# Patient Record
Sex: Female | Born: 1960 | Race: White | Hispanic: No | Marital: Married | Smoking: Former smoker
Health system: Southern US, Community
[De-identification: ages and names within clinical notes are randomized; demographics above are authoritative.]

## PROBLEM LIST (undated history)

## (undated) DIAGNOSIS — G4730 Sleep apnea, unspecified: Secondary | ICD-10-CM

## (undated) DIAGNOSIS — R911 Solitary pulmonary nodule: Secondary | ICD-10-CM

## (undated) DIAGNOSIS — Z86010 Personal history of colonic polyps: Secondary | ICD-10-CM

## (undated) DIAGNOSIS — J189 Pneumonia, unspecified organism: Secondary | ICD-10-CM

## (undated) DIAGNOSIS — K219 Gastro-esophageal reflux disease without esophagitis: Secondary | ICD-10-CM

## (undated) DIAGNOSIS — Z87898 Personal history of other specified conditions: Secondary | ICD-10-CM

## (undated) DIAGNOSIS — E785 Hyperlipidemia, unspecified: Secondary | ICD-10-CM

## (undated) DIAGNOSIS — R609 Edema, unspecified: Secondary | ICD-10-CM

## (undated) DIAGNOSIS — D649 Anemia, unspecified: Secondary | ICD-10-CM

## (undated) DIAGNOSIS — E039 Hypothyroidism, unspecified: Secondary | ICD-10-CM

## (undated) DIAGNOSIS — T7840XA Allergy, unspecified, initial encounter: Secondary | ICD-10-CM

## (undated) HISTORY — DX: Solitary pulmonary nodule: R91.1

## (undated) HISTORY — DX: Personal history of other specified conditions: Z87.898

## (undated) HISTORY — PX: HERNIA REPAIR: SHX51

## (undated) HISTORY — DX: Hyperlipidemia, unspecified: E78.5

## (undated) HISTORY — PX: CHOLECYSTECTOMY: SHX55

## (undated) HISTORY — DX: Pneumonia, unspecified organism: J18.9

## (undated) HISTORY — DX: Edema, unspecified: R60.9

## (undated) HISTORY — DX: Allergy, unspecified, initial encounter: T78.40XA

## (undated) HISTORY — PX: COLONOSCOPY: SHX174

## (undated) HISTORY — DX: Hypothyroidism, unspecified: E03.9

## (undated) HISTORY — DX: Personal history of colonic polyps: Z86.010

## (undated) HISTORY — DX: Gastro-esophageal reflux disease without esophagitis: K21.9

## (undated) HISTORY — DX: Sleep apnea, unspecified: G47.30

## (undated) HISTORY — DX: Anemia, unspecified: D64.9

---

## 1998-08-02 ENCOUNTER — Other Ambulatory Visit: Admission: RE | Admit: 1998-08-02 | Discharge: 1998-08-02 | Payer: Self-pay | Admitting: *Deleted

## 1999-10-25 ENCOUNTER — Other Ambulatory Visit: Admission: RE | Admit: 1999-10-25 | Discharge: 1999-10-25 | Payer: Self-pay | Admitting: *Deleted

## 2000-04-21 ENCOUNTER — Encounter: Payer: Self-pay | Admitting: Surgery

## 2000-04-21 ENCOUNTER — Encounter: Admission: RE | Admit: 2000-04-21 | Discharge: 2000-04-21 | Payer: Self-pay | Admitting: Surgery

## 2000-08-08 ENCOUNTER — Encounter: Payer: Self-pay | Admitting: *Deleted

## 2000-08-08 ENCOUNTER — Encounter: Admission: RE | Admit: 2000-08-08 | Discharge: 2000-08-08 | Payer: Self-pay | Admitting: *Deleted

## 2000-09-19 ENCOUNTER — Encounter (INDEPENDENT_AMBULATORY_CARE_PROVIDER_SITE_OTHER): Payer: Self-pay | Admitting: *Deleted

## 2000-09-19 ENCOUNTER — Ambulatory Visit (HOSPITAL_BASED_OUTPATIENT_CLINIC_OR_DEPARTMENT_OTHER): Admission: RE | Admit: 2000-09-19 | Discharge: 2000-09-19 | Payer: Self-pay | Admitting: *Deleted

## 2000-10-24 ENCOUNTER — Other Ambulatory Visit: Admission: RE | Admit: 2000-10-24 | Discharge: 2000-10-24 | Payer: Self-pay | Admitting: *Deleted

## 2001-09-23 ENCOUNTER — Encounter: Payer: Self-pay | Admitting: *Deleted

## 2001-09-23 ENCOUNTER — Encounter: Admission: RE | Admit: 2001-09-23 | Discharge: 2001-09-23 | Payer: Self-pay | Admitting: *Deleted

## 2002-02-25 ENCOUNTER — Other Ambulatory Visit: Admission: RE | Admit: 2002-02-25 | Discharge: 2002-02-25 | Payer: Self-pay | Admitting: *Deleted

## 2002-07-22 ENCOUNTER — Encounter: Admission: RE | Admit: 2002-07-22 | Discharge: 2002-07-22 | Payer: Self-pay | Admitting: *Deleted

## 2002-07-22 ENCOUNTER — Encounter: Payer: Self-pay | Admitting: *Deleted

## 2002-08-13 ENCOUNTER — Encounter: Payer: Self-pay | Admitting: *Deleted

## 2002-08-13 ENCOUNTER — Encounter: Admission: RE | Admit: 2002-08-13 | Discharge: 2002-08-13 | Payer: Self-pay | Admitting: *Deleted

## 2002-09-05 ENCOUNTER — Ambulatory Visit (HOSPITAL_BASED_OUTPATIENT_CLINIC_OR_DEPARTMENT_OTHER): Admission: RE | Admit: 2002-09-05 | Discharge: 2002-09-05 | Payer: Self-pay | Admitting: *Deleted

## 2002-12-22 ENCOUNTER — Encounter: Admission: RE | Admit: 2002-12-22 | Discharge: 2002-12-22 | Payer: Self-pay | Admitting: Obstetrics and Gynecology

## 2002-12-22 ENCOUNTER — Encounter: Payer: Self-pay | Admitting: Obstetrics and Gynecology

## 2002-12-29 ENCOUNTER — Encounter: Payer: Self-pay | Admitting: *Deleted

## 2002-12-29 ENCOUNTER — Encounter: Admission: RE | Admit: 2002-12-29 | Discharge: 2002-12-29 | Payer: Self-pay | Admitting: *Deleted

## 2003-02-08 ENCOUNTER — Encounter: Payer: Self-pay | Admitting: *Deleted

## 2003-02-08 ENCOUNTER — Encounter: Admission: RE | Admit: 2003-02-08 | Discharge: 2003-02-08 | Payer: Self-pay | Admitting: *Deleted

## 2003-12-26 ENCOUNTER — Encounter: Admission: RE | Admit: 2003-12-26 | Discharge: 2003-12-26 | Payer: Self-pay | Admitting: Obstetrics and Gynecology

## 2004-02-01 ENCOUNTER — Other Ambulatory Visit: Admission: RE | Admit: 2004-02-01 | Discharge: 2004-02-01 | Payer: Self-pay | Admitting: Obstetrics and Gynecology

## 2005-02-19 ENCOUNTER — Encounter: Admission: RE | Admit: 2005-02-19 | Discharge: 2005-02-19 | Payer: Self-pay | Admitting: Obstetrics and Gynecology

## 2005-07-12 ENCOUNTER — Encounter: Admission: RE | Admit: 2005-07-12 | Discharge: 2005-07-12 | Payer: Self-pay | Admitting: Family Medicine

## 2005-07-25 ENCOUNTER — Ambulatory Visit: Payer: Self-pay | Admitting: Internal Medicine

## 2005-08-16 ENCOUNTER — Encounter: Admission: RE | Admit: 2005-08-16 | Discharge: 2005-08-16 | Payer: Self-pay | Admitting: Family Medicine

## 2005-08-19 ENCOUNTER — Ambulatory Visit: Payer: Self-pay | Admitting: Internal Medicine

## 2005-10-22 ENCOUNTER — Ambulatory Visit: Payer: Self-pay | Admitting: Internal Medicine

## 2005-10-29 ENCOUNTER — Ambulatory Visit: Payer: Self-pay | Admitting: Internal Medicine

## 2005-11-15 ENCOUNTER — Ambulatory Visit (HOSPITAL_COMMUNITY): Admission: RE | Admit: 2005-11-15 | Discharge: 2005-11-15 | Payer: Self-pay | Admitting: Internal Medicine

## 2005-11-29 ENCOUNTER — Emergency Department (HOSPITAL_COMMUNITY): Admission: EM | Admit: 2005-11-29 | Discharge: 2005-11-29 | Payer: Self-pay | Admitting: *Deleted

## 2005-12-18 ENCOUNTER — Encounter (INDEPENDENT_AMBULATORY_CARE_PROVIDER_SITE_OTHER): Payer: Self-pay | Admitting: *Deleted

## 2005-12-18 ENCOUNTER — Ambulatory Visit (HOSPITAL_COMMUNITY): Admission: RE | Admit: 2005-12-18 | Discharge: 2005-12-18 | Payer: Self-pay | Admitting: *Deleted

## 2006-03-25 ENCOUNTER — Encounter: Admission: RE | Admit: 2006-03-25 | Discharge: 2006-03-25 | Payer: Self-pay | Admitting: Family Medicine

## 2006-04-01 ENCOUNTER — Encounter: Admission: RE | Admit: 2006-04-01 | Discharge: 2006-04-01 | Payer: Self-pay | Admitting: Obstetrics and Gynecology

## 2006-07-24 ENCOUNTER — Encounter: Admission: RE | Admit: 2006-07-24 | Discharge: 2006-10-22 | Payer: Self-pay | Admitting: Orthopedic Surgery

## 2007-02-11 ENCOUNTER — Ambulatory Visit: Payer: Self-pay | Admitting: Oncology

## 2007-02-19 LAB — MORPHOLOGY

## 2007-02-19 LAB — CBC & DIFF AND RETIC
BASO%: 0.5 % (ref 0.0–2.0)
HCT: 35.5 % (ref 34.8–46.6)
IRF: 0.26 (ref 0.130–0.330)
MCHC: 34 g/dL (ref 32.0–36.0)
MONO#: 0.3 10*3/uL (ref 0.1–0.9)
NEUT%: 57.4 % (ref 39.6–76.8)
RBC: 5.09 10*6/uL (ref 3.70–5.32)
RDW: 19.6 % — ABNORMAL HIGH (ref 11.3–14.5)
RETIC #: 57.5 10*3/uL (ref 19.7–115.1)
Retic %: 1.1 % (ref 0.4–2.3)
WBC: 4.7 10*3/uL (ref 3.9–10.0)
lymph#: 1.5 10*3/uL (ref 0.9–3.3)

## 2007-02-19 LAB — ERYTHROCYTE SEDIMENTATION RATE: Sed Rate: 11 mm/hr (ref 0–30)

## 2007-02-25 ENCOUNTER — Encounter: Admission: RE | Admit: 2007-02-25 | Discharge: 2007-02-25 | Payer: Self-pay | Admitting: Oncology

## 2007-02-25 DIAGNOSIS — K449 Diaphragmatic hernia without obstruction or gangrene: Secondary | ICD-10-CM | POA: Insufficient documentation

## 2007-02-25 LAB — HEMOCHROMATOSIS DNA-PCR(C282Y,H63D)

## 2007-02-25 LAB — COMPREHENSIVE METABOLIC PANEL
ALT: 18 U/L (ref 0–35)
Albumin: 4.2 g/dL (ref 3.5–5.2)
CO2: 24 mEq/L (ref 19–32)
Glucose, Bld: 144 mg/dL — ABNORMAL HIGH (ref 70–99)
Potassium: 3.4 mEq/L — ABNORMAL LOW (ref 3.5–5.3)
Sodium: 136 mEq/L (ref 135–145)
Total Protein: 7.1 g/dL (ref 6.0–8.3)

## 2007-02-25 LAB — FERRITIN: Ferritin: 7 ng/mL — ABNORMAL LOW (ref 10–291)

## 2007-02-25 LAB — HEPATITIS C ANTIBODY: HCV Ab: NEGATIVE

## 2007-02-25 LAB — IRON AND TIBC
%SAT: 9 % — ABNORMAL LOW (ref 20–55)
TIBC: 412 ug/dL (ref 250–470)
UIBC: 373 ug/dL

## 2007-02-25 LAB — LACTATE DEHYDROGENASE: LDH: 184 U/L (ref 94–250)

## 2007-04-16 ENCOUNTER — Ambulatory Visit: Payer: Self-pay | Admitting: Oncology

## 2007-04-20 LAB — CBC & DIFF AND RETIC
BASO%: 0.5 % (ref 0.0–2.0)
Basophils Absolute: 0 10*3/uL (ref 0.0–0.1)
EOS%: 2.3 % (ref 0.0–7.0)
HCT: 38.6 % (ref 34.8–46.6)
HGB: 13.5 g/dL (ref 11.6–15.9)
LYMPH%: 26.8 % (ref 14.0–48.0)
MCH: 26.9 pg (ref 26.0–34.0)
MCHC: 35 g/dL (ref 32.0–36.0)
MCV: 77.1 fL — ABNORMAL LOW (ref 81.0–101.0)
MONO%: 7.2 % (ref 0.0–13.0)
NEUT%: 63.2 % (ref 39.6–76.8)
lymph#: 1.4 10*3/uL (ref 0.9–3.3)

## 2007-04-20 LAB — VITAMIN B12: Vitamin B-12: 334 pg/mL (ref 211–911)

## 2007-04-20 LAB — FERRITIN: Ferritin: 7 ng/mL — ABNORMAL LOW (ref 10–291)

## 2007-04-20 LAB — IRON AND TIBC
%SAT: 13 % — ABNORMAL LOW (ref 20–55)
TIBC: 414 ug/dL (ref 250–470)

## 2007-04-24 ENCOUNTER — Ambulatory Visit: Payer: Self-pay | Admitting: Internal Medicine

## 2007-06-24 ENCOUNTER — Encounter: Admission: RE | Admit: 2007-06-24 | Discharge: 2007-06-24 | Payer: Self-pay | Admitting: Obstetrics and Gynecology

## 2007-09-04 ENCOUNTER — Encounter: Admission: RE | Admit: 2007-09-04 | Discharge: 2007-09-04 | Payer: Self-pay | Admitting: Internal Medicine

## 2007-10-14 ENCOUNTER — Ambulatory Visit: Payer: Self-pay

## 2007-10-14 ENCOUNTER — Ambulatory Visit: Payer: Self-pay | Admitting: Cardiology

## 2007-10-27 ENCOUNTER — Ambulatory Visit: Payer: Self-pay

## 2007-12-04 ENCOUNTER — Ambulatory Visit: Payer: Self-pay | Admitting: Cardiology

## 2008-02-17 ENCOUNTER — Ambulatory Visit: Payer: Self-pay | Admitting: Internal Medicine

## 2008-02-17 DIAGNOSIS — R1013 Epigastric pain: Secondary | ICD-10-CM | POA: Insufficient documentation

## 2008-02-17 DIAGNOSIS — K3189 Other diseases of stomach and duodenum: Secondary | ICD-10-CM | POA: Insufficient documentation

## 2008-03-16 DIAGNOSIS — G4730 Sleep apnea, unspecified: Secondary | ICD-10-CM | POA: Insufficient documentation

## 2008-03-16 DIAGNOSIS — Z9189 Other specified personal risk factors, not elsewhere classified: Secondary | ICD-10-CM | POA: Insufficient documentation

## 2008-03-16 DIAGNOSIS — Z862 Personal history of diseases of the blood and blood-forming organs and certain disorders involving the immune mechanism: Secondary | ICD-10-CM | POA: Insufficient documentation

## 2008-03-16 DIAGNOSIS — E782 Mixed hyperlipidemia: Secondary | ICD-10-CM | POA: Insufficient documentation

## 2008-03-16 DIAGNOSIS — Z8719 Personal history of other diseases of the digestive system: Secondary | ICD-10-CM | POA: Insufficient documentation

## 2008-03-16 DIAGNOSIS — J301 Allergic rhinitis due to pollen: Secondary | ICD-10-CM | POA: Insufficient documentation

## 2008-07-28 ENCOUNTER — Encounter: Admission: RE | Admit: 2008-07-28 | Discharge: 2008-07-28 | Payer: Self-pay | Admitting: Obstetrics

## 2008-09-06 ENCOUNTER — Ambulatory Visit: Payer: Self-pay | Admitting: Internal Medicine

## 2008-11-17 ENCOUNTER — Ambulatory Visit: Payer: Self-pay | Admitting: Internal Medicine

## 2009-01-12 ENCOUNTER — Ambulatory Visit: Payer: Self-pay | Admitting: Internal Medicine

## 2009-06-05 ENCOUNTER — Ambulatory Visit: Payer: Self-pay | Admitting: Internal Medicine

## 2009-07-11 ENCOUNTER — Ambulatory Visit: Payer: Self-pay | Admitting: Internal Medicine

## 2009-12-04 ENCOUNTER — Ambulatory Visit: Payer: Self-pay | Admitting: Internal Medicine

## 2010-01-11 ENCOUNTER — Ambulatory Visit: Payer: Self-pay | Admitting: Internal Medicine

## 2010-02-19 ENCOUNTER — Encounter: Admission: RE | Admit: 2010-02-19 | Discharge: 2010-02-19 | Payer: Self-pay | Admitting: Obstetrics

## 2010-05-10 ENCOUNTER — Ambulatory Visit: Payer: Self-pay | Admitting: Internal Medicine

## 2010-06-12 ENCOUNTER — Ambulatory Visit: Payer: Self-pay | Admitting: Internal Medicine

## 2010-12-02 ENCOUNTER — Encounter: Payer: Self-pay | Admitting: Family Medicine

## 2011-03-26 NOTE — Assessment & Plan Note (Signed)
[facility]                            CARDIOLOGY OFFICE NOTE   TOMASITA, BEEVERS                   MRN:          [id number]  DATE:12/04/2007                            DOB:          1960-12-05    [name] is a very pleasant 50 year old female recently saw for  atypical chest pain and palpitations.  We scheduled her to have a  Myoview which was performed October 27, 2007.  It was normal with an  ejection fraction of 66%.  She also had an event monitor.  She did send  a strip associated chest tightness but she states she did not have the  strong palpitations.  Note that the monitor showed sinus rhythm.  She  has not had any further palpitations but she continues to not feel well.  She has varying symptoms including tingling/numbness in her upper  extremities and a warm feeling all over times.  There is no chest pain  or shortness of breath.  She feels fatigued as well.  Her medications  include Zyrtec, Zegerid, Lasix 40 mg daily, aspirin 81 daily, Advil and  Tricor.   PHYSICAL EXAM:  Today shows a blood pressure of 120/90 pulses 83.  HEENT:  Normal.  NECK:  Supple.  CHEST:  Clear.  CARDIOVASCULAR:  Regular rate and rhythm.  Her abdominal exam shows no tenderness.  EXTREMITIES:  Show no edema.   Electrocardiogram shows a sinus rhythm at a rate of 83.  There are  nonspecific ST changes.   DIAGNOSIS:  1. Recent palpitations - her event monitor was unremarkable with the      she not have a palpitations.  She does work close to [facility] and gynecologist's office.  If she has recurrent      palpitations it is explained that she did have a telemetry strip or      EKG performed so we can document any significant arrhythmia.  2. Recent vague neck and shoulder surgery - her Myoview was normal.  3. Hyperemia - she will continue on fish oil and this is being managed      by Dr. Lenord Fellers.  4. History of statin allergy.   We  discussed followed in 6 months to see if she had improved, but she  has elected to see [place] back as needed and follow up Dr. Lenord Fellers next week.  May be worthwhile check a thyroid, which she thinks a Dr. Lenord Fellers has  already done.     Madolyn Frieze Jens Som, MD, [facility]  Electronically Signed    BSC/MedQ  DD: 12/04/2007  DT: 12/04/2007  Job #: [id number]   cc:   Luanna Cole. Lenord Fellers, M.D.
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
## 2011-03-26 NOTE — Assessment & Plan Note (Signed)
[facility]                            CARDIOLOGY OFFICE NOTE   BAMBI, FEHNEL                   MRN:          [id number]  DATE:10/14/2007                            DOB:          June 07, 1961    Mrs. Mikus is a very pleasant 50 year old female who I am asked to  evaluate for palpitations and atypical chest/neck pain.  The patient has  no prior cardiac history.  She apparently had a problem with Crestor  approximately one year ago.  I do not have full records available  concerning this issue.  However, she states she has not felt well since  then.  She has been on Lasix 40 mg p.o. daily since that time.  This is  for pedal edema.  She also feels somewhat fatigued at times.  She also  occasionally has some pain in her neck and shoulders and upper chest.  The pain is not exertional.  She does not have significant dyspnea on  exertion, orthopnea or PND, but she does have pedal edema.  She also  occasionally has palpitations.  She had a more significant episode  approximately 2 to 4 weeks ago.  This occurred in the early morning  hours.  She states her heart was pounding.  There was no associated  chest pain or syncope, and there was no shortness of breath.  It lasted  for approximately 30 minutes, then slowly resolved.  She states she has  also had other shorter episodes of her heart racing.  Note she did  take Xanax for the episode that occurred in the early morning hours.  She felt that it may be related to anxiety.  Because of the palpitations  and the chest pain, we were asked to further evaluate.   MEDICATIONS:  Her medications at present include:  1. Zyrtec.  2. Zegerid 40 mg p.o. daily.  3. Lasix 40 mg p.o. q. daily.  4. Aspirin 81 mg p.o. q. daily.  5. Advil.  6. Fish oil.   ALLERGIES:  SHE HAS AN ALLERGY TO PENICILLIN, CRESTOR AND ZOCOR.  SHE  HAS BEEN TOLD NOT TO TAKE STATINS.   SOCIAL HISTORY:  She has a remote history of  tobacco use but has not  smoked in approximately 15 to 20 years.  She rarely consumes alcohol.   FAMILY HISTORY:  Positive for coronary artery disease in her father.   PAST MEDICAL HISTORY:  There is no diabetes mellitus or hypertension,  but there is hyperlipidemia.  She has a remote history of mild asthma,  as well as a history of pneumonia.  She has a history of reflux, as well  as irritable bowel syndrome.  She has a history of allergy and sinus  problems, as well as sleep apnea.  She also has a history of chronic  headaches.  She has had a prior cholecystectomy.  She has also had an  umbilical hernia repair, as well as a removal of an abdominal lipoma.   REVIEW OF SYSTEMS:  She does not have any headaches at present.  SKIN:  Warm and dry.  There is  no fevers or chills, and there is no productive  cough.  There is no hemoptysis.  There is no dysphagia or odynophagia,  melena or hematochezia.  There is no dysuria or hematuria.  There is no  rashes or seizure activity.  There is no orthopnea or PND, but there can  be pedal edema, particularly if she does not take her Lasix.  She does  have some general fatigue.  The remaining systems are negative.   PHYSICAL EXAMINATION:  VITAL SIGNS:  Today, shows a blood pressure of  125/79, and her pulse is 75.  She weighs 189 pounds.  GENERAL:  She is well-developed and mildly obese.  She is in no acute  distress at present.  SKIN:  Warm and dry.  MENTATION:  She does not appear to be depressed, and there is normal  __________.  HEENT:  Normal with normal eyelids.  NECK:  Supple with a normal upstroke bilaterally and no bruits noted.  There is no jugular venous distention and no thyromegaly is noted.  CHEST:  Clear to auscultation, normal expansion.  CARDIOVASCULAR:  Regular rate and rhythm, normal S1 and S2, no murmurs,  rubs or gallops noted.  ABDOMEN:  Nontender, nondistended, positive bowel sounds.  No  hepatosplenomegaly and no masses  seen.  She has no abdominal bruits.  EXTREMITIES:  She has 2+ femoral pulses bilaterally, no bruits.  Extremities show no edema.  I can palpate no cords.  She has 2+ dorsalis  pedis pulses bilaterally.  NEUROLOGIC:  Grossly intact.   I do have an electrocardiogram from Dr. Beryle Quant office dated October 06, 2007.  She had a sinus rhythm with minor nonspecific ST changes.   LABORATORY:  From October 06, 2007, this showed normal renal function  and normal potassium.  A free T4 was normal.  Hemoglobin and hematocrit  normal at 14.1 and 41.9, respectively.  An ESR was 5.  A TSH was normal.   DIAGNOSES:  1. Palpitations - The etiology of this is unclear at this time.  She      may have had a sinus tachycardia.  She does state that her symptoms      resolved slowly over time.  This would be unusual for an      arrhythmia.  However, we will schedule an event monitor to exclude      significant arrhythmia.  2. Vague neck and shoulder, as well as upper chest pain - We will      schedule her to have a stress Myoview, although this is not clearly      cardiac.  She does have a family history of coronary disease.  3. Hyperlipidemia - She will continue on her fish oil, and this is      being managed by Dr. Lenord Fellers.  4. History of Statin allergy.   I will see her back in approximately 6 weeks, and we will review her  studies.     Madolyn Frieze Jens Som, MD, [facility]  Electronically Signed    BSC/MedQ  DD: 10/14/2007  DT: 10/14/2007  Job #: [id number]   cc:   Luanna Cole. Lenord Fellers, M.D.

## 2011-03-29 NOTE — Op Note (Signed)
[facility]  Patient:    Nancy Lawrence, Nancy Lawrence               MRN: [id number] Proc. Date: 09/19/00 Adm. Date:  [id number] Attending:  Stephenie Acres                           Operative Report  PREOPERATIVE DIAGNOSES: 1. Umbilical hernia. 2. Lipoma of the abdominal wall.  POSTOPERATIVE DIAGNOSES: 1. Umbilical hernia. 2. Lipoma of the abdominal wall.  PROCEDURE: 1. Excision of lipoma of abdominal wall. 2. Umbilical hernia repair.  ANESTHESIA:  Local MAC.  SURGEON:  Catalina Lunger, M.D.  DESCRIPTION OF PROCEDURE:  The patient was taken to the operating room and placed in the supine position.  After adequate anesthesia was induced using mask technique, the abdomen was prepped and draped in the normal sterile fashion.  Using 1% lidocaine with bicarbonate, the skin overlying and surrounding the lipoma in the left upper quadrant was anesthetized.  All subcutaneous tissue was also anesthetized.  A transverse incision was made and dissected down through the subcutaneous fat, onto a benign-appearing fatty tumor.  It was easily encapsulated and delivered through the wound.  It was excised in its entirety using cautery.  Adequate hemostasis was ensured and the skin was closed with subcuticular #4-0 Monocryl.  Steri-Strips and sterile dressing were applied.  I then turned my attention to the umbilical hernia.  A transverse supraumbilical incision was made, dissected down onto the hernia sac, which was easily excised.  The fascial defect was measured approximately 0.5 cm, was identified, freed up from the surrounding adhesions, and closed using two interrupted #1 Surgilon.  The skin was closed with subcuticular #4-0 Monocryl. Steri-Strips and sterile dressing were applied.  The patient tolerated the procedure well and went to the post anesthesia care unit in good condition. DD:  09/19/00 TD:  09/20/00 Job: [id number]
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
## 2011-03-29 NOTE — Op Note (Signed)
Nancy Lawrence, Nancy Lawrence            ACCOUNT NO.:  [id number]   MEDICAL RECORD NO.:  [id number]          PATIENT TYPE:  AMB   LOCATION:  DAY                          FACILITY:  [facility]   PHYSICIAN:  Alfonse Ras, MD   DATE OF BIRTH:  1961/05/24   DATE OF PROCEDURE:  12/18/2005  DATE OF DISCHARGE:                                 OPERATIVE REPORT   PREOPERATIVE DIAGNOSIS:  Biliary dyskinesia.   POSTOPERATIVE DIAGNOSIS:  Biliary dyskinesia.   PROCEDURE:  Laparoscopic cholecystectomy with intraoperative cholangiogram.   SURGEON:  Alfonse Ras, MD   ASSISTANT:  Thornton Dales, MD.   ANESTHESIA:  General.   DESCRIPTION OF PROCEDURE:  The patient was taken to the operating room,  placed in a supine position and after adequate general anesthesia was  induced using an endotracheal tube, the abdomen was prepped and draped in a  normal sterile fashion. Using a transverse infraumbilical incision, I  dissected down to the fascia. The fascia was opened vertically. An #0 Vicryl  pursestring suture was placed around the fascial defect and Hasson trocar  was placed in the abdomen. Pneumoperitoneum was obtained. Under direct  vision, some adhesions of omentum to the right upper quadrant were taken  down after 5 mm trocars were placed. This gave good visualization of the  gallbladder which was retracted cephalad. Dissecting at the neck of the  gallbladder, I visualized the cystic duct with a critical view. It was  clipped up on the gallbladder, small ductotomy was made and cholangiogram  was performed. This showed a rather long cystic duct, normal filling of the  common bile duct, bilateral hepatic ducts, free flow into the duodenum  without filling defects. The catheter was removed and the distal cystic duct  was doubly clipped and divided. The cystic artery was dissected in a similar  fashion, triply clipped and divided. The gallbladder was taken off the  gallbladder bed using Bovie  electrocautery and removed, placed in an  EndoCatch bag and removed through the umbilical port. Adequate hemostasis  was assured. Trocars were removed. Pneumoperitoneum was released. An  infraumbilical fascial defect was closed with the #0 Vicryl pursestring  suture. Skin incisions were closed with subcuticular 4-0 Monocryl. Steri-  Strips and sterile dressings were applied. The patient tolerated the  procedure well and went to PACU in good condition.      Alfonse Ras, MD  Electronically Signed     KRE/MEDQ  D:  12/18/2005  T:  12/18/2005  Job:  [id number]

## 2011-04-09 ENCOUNTER — Other Ambulatory Visit: Payer: Self-pay | Admitting: Orthopedic Surgery

## 2011-04-09 DIAGNOSIS — M545 Low back pain, unspecified: Secondary | ICD-10-CM

## 2011-04-12 ENCOUNTER — Other Ambulatory Visit: Payer: Self-pay | Admitting: *Deleted

## 2011-04-12 DIAGNOSIS — I10 Essential (primary) hypertension: Secondary | ICD-10-CM

## 2011-04-12 MED ORDER — FENOFIBRATE 145 MG PO TABS: 145.0000 mg | ORAL_TABLET | Freq: Every day | ORAL | Status: DC

## 2011-04-16 ENCOUNTER — Ambulatory Visit
Admission: RE | Admit: 2011-04-16 | Discharge: 2011-04-16 | Disposition: A | Discharge status: Home / Self Care | Payer: Federal, State, Local not specified - PPO | Source: Ambulatory Visit | Attending: Orthopedic Surgery | Admitting: Orthopedic Surgery

## 2011-04-16 DIAGNOSIS — M545 Low back pain, unspecified: Secondary | ICD-10-CM

## 2011-06-20 ENCOUNTER — Encounter: Payer: Self-pay | Admitting: Internal Medicine

## 2011-06-21 ENCOUNTER — Ambulatory Visit (INDEPENDENT_AMBULATORY_CARE_PROVIDER_SITE_OTHER): Payer: Federal, State, Local not specified - PPO | Admitting: Internal Medicine

## 2011-06-21 ENCOUNTER — Encounter: Payer: Self-pay | Admitting: Internal Medicine

## 2011-06-21 VITALS — BP 116/78 | HR 84 | Temp 99.4°F | Ht 65.75 in | Wt 193.0 lb

## 2011-06-21 DIAGNOSIS — J309 Allergic rhinitis, unspecified: Secondary | ICD-10-CM

## 2011-06-21 DIAGNOSIS — E781 Pure hyperglyceridemia: Secondary | ICD-10-CM

## 2011-06-21 DIAGNOSIS — F419 Anxiety disorder, unspecified: Secondary | ICD-10-CM

## 2011-06-21 DIAGNOSIS — F32A Depression, unspecified: Secondary | ICD-10-CM

## 2011-06-21 DIAGNOSIS — Z0000 Encounter for general adult medical examination without abnormal findings: Secondary | ICD-10-CM

## 2011-06-21 DIAGNOSIS — E8779 Other fluid overload: Secondary | ICD-10-CM

## 2011-06-21 DIAGNOSIS — F411 Generalized anxiety disorder: Secondary | ICD-10-CM

## 2011-06-21 DIAGNOSIS — F329 Major depressive disorder, single episode, unspecified: Secondary | ICD-10-CM

## 2011-06-21 DIAGNOSIS — E559 Vitamin D deficiency, unspecified: Secondary | ICD-10-CM

## 2011-06-21 DIAGNOSIS — R609 Edema, unspecified: Secondary | ICD-10-CM

## 2011-06-21 DIAGNOSIS — E669 Obesity, unspecified: Secondary | ICD-10-CM

## 2011-06-21 DIAGNOSIS — K219 Gastro-esophageal reflux disease without esophagitis: Secondary | ICD-10-CM

## 2011-06-21 LAB — POCT URINALYSIS DIPSTICK
Ketones, UA: NEGATIVE
Leukocytes, UA: NEGATIVE
Protein, UA: NEGATIVE

## 2011-07-08 ENCOUNTER — Encounter: Payer: Self-pay | Admitting: Internal Medicine

## 2011-07-08 ENCOUNTER — Ambulatory Visit (INDEPENDENT_AMBULATORY_CARE_PROVIDER_SITE_OTHER): Payer: Federal, State, Local not specified - PPO | Admitting: Internal Medicine

## 2011-07-08 VITALS — BP 118/76 | HR 64 | Temp 98.1°F | Ht 66.0 in | Wt 198.0 lb

## 2011-07-08 DIAGNOSIS — J069 Acute upper respiratory infection, unspecified: Secondary | ICD-10-CM

## 2011-07-08 DIAGNOSIS — H6690 Otitis media, unspecified, unspecified ear: Secondary | ICD-10-CM

## 2011-07-08 DIAGNOSIS — H6120 Impacted cerumen, unspecified ear: Secondary | ICD-10-CM

## 2011-07-08 MED ORDER — NEOMYCIN-COLIST-HC-THONZONIUM 3.3-3-10-0.5 MG/ML OT SUSP
4.0000 [drp] | Freq: Once | OTIC | Status: AC
  Administered 2011-07-08: 4 [drp] via OTIC
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
## 2011-07-11 ENCOUNTER — Other Ambulatory Visit: Payer: Self-pay | Admitting: Obstetrics

## 2011-07-11 DIAGNOSIS — Z1231 Encounter for screening mammogram for malignant neoplasm of breast: Secondary | ICD-10-CM

## 2011-07-17 ENCOUNTER — Other Ambulatory Visit: Payer: Self-pay | Admitting: Dermatology

## 2011-07-19 ENCOUNTER — Ambulatory Visit
Admission: RE | Admit: 2011-07-19 | Discharge: 2011-07-19 | Disposition: A | Discharge status: Home / Self Care | Payer: Federal, State, Local not specified - PPO | Source: Ambulatory Visit | Attending: Obstetrics | Admitting: Obstetrics

## 2011-07-19 DIAGNOSIS — Z1231 Encounter for screening mammogram for malignant neoplasm of breast: Secondary | ICD-10-CM

## 2011-08-01 ENCOUNTER — Telehealth: Payer: Self-pay

## 2011-08-01 NOTE — Telephone Encounter (Signed)
I  do not  perform these injections. I can see her tomorrow and prescribe a steroid dose pack but she needs to keep appt with orthopedist.

## 2011-08-01 NOTE — Telephone Encounter (Signed)
Patient calls with a flare-up of back pain/sciatica and can't see the ortho till Monday. Has used Ibuprofen 800mg  in daytime, and Vicodin at hs with little relief. Inquiring if you do steroid injections in the back; I informed her I doubted so. Any reccomendations?

## 2011-08-02 ENCOUNTER — Ambulatory Visit (INDEPENDENT_AMBULATORY_CARE_PROVIDER_SITE_OTHER): Payer: Federal, State, Local not specified - PPO | Admitting: Internal Medicine

## 2011-08-02 ENCOUNTER — Encounter: Payer: Self-pay | Admitting: Internal Medicine

## 2011-08-02 VITALS — BP 108/84 | HR 80 | Temp 99.1°F | Wt 198.0 lb

## 2011-08-02 DIAGNOSIS — M5430 Sciatica, unspecified side: Secondary | ICD-10-CM

## 2011-08-06 DIAGNOSIS — M5430 Sciatica, unspecified side: Secondary | ICD-10-CM | POA: Insufficient documentation

## 2011-08-06 NOTE — Progress Notes (Signed)
  Subjective:    Patient ID: Nancy Lawrence, female    DOB: 1961-02-28, 50 y.o.   MRN: [id number]  HPI patient long-standing history of recurrent back pain followed at [facility] by Dr. Shon Baton and Dr. Ethelene Hal. Unable to get an appointment with them until next week she says. Having considerable problem with pain in the buttock radiating down leg. Has had similar symptoms before. No weakness in leg. No bladder or bowel dysfunction.    Review of Systems     Objective:   Physical Exam straight leg raising positive on the right; negative on the left at 90; muscle strength in lower extremities 5 over 5. Deep tendon reflexes of the knees 2+ and symmetrical.        Assessment & Plan:  Sciatica Plan: Sterapred DS 10 mg 6 day dosepak to take as directed  Vicodin 5/500 (#40) 1 by mouth every 6 hours when necessary pain  Flexeril 10 mg (#30) 1/2-1 by mouth each bedtime  Keep appointment with orthopedist next week

## 2011-08-12 DIAGNOSIS — R609 Edema, unspecified: Secondary | ICD-10-CM | POA: Insufficient documentation

## 2011-08-12 DIAGNOSIS — F419 Anxiety disorder, unspecified: Secondary | ICD-10-CM | POA: Insufficient documentation

## 2011-08-12 DIAGNOSIS — E559 Vitamin D deficiency, unspecified: Secondary | ICD-10-CM | POA: Insufficient documentation

## 2011-08-12 DIAGNOSIS — E669 Obesity, unspecified: Secondary | ICD-10-CM | POA: Insufficient documentation

## 2011-08-12 DIAGNOSIS — F329 Major depressive disorder, single episode, unspecified: Secondary | ICD-10-CM | POA: Insufficient documentation

## 2011-08-12 DIAGNOSIS — Z8659 Personal history of other mental and behavioral disorders: Secondary | ICD-10-CM | POA: Insufficient documentation

## 2011-08-12 NOTE — Progress Notes (Signed)
  Subjective:    Patient ID: Nancy Lawrence, female    DOB: 1961/04/05, 50 y.o.   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 History of hypertriglyceridemia, fluid retention, iron deficiency anemia, vitamin D deficiency, GE reflux. Had pneumonia approximately 2000. Allergic to penicillin.? Adverse reaction to Crestor and Celexa. History of stress and anxiety. Had tetanus immunization 2008. Had colonoscopy 2006. Had cholecystectomy 2007. Umbilical hernia repair and lipoma removal around 2000. Pilonidal cyst excised 1994.    Review of Systems  Constitutional: Positive for fatigue.       Obesity and lack of exercise  HENT: Negative.   Eyes: Negative.   Respiratory: Negative.   Cardiovascular: Negative.   Gastrointestinal: Negative.   Genitourinary: Negative.   Musculoskeletal: Negative.   Neurological: Negative.   Hematological: Negative.   Psychiatric/Behavioral: Positive for dysphoric mood.       Objective:   Physical Exam  Constitutional: She is oriented to person, place, and time. She appears well-nourished.  HENT:  Head: Normocephalic and atraumatic.  Right Ear: External ear normal.  Left Ear: External ear normal.  Mouth/Throat: Oropharynx is clear and moist.  Eyes: EOM are normal. Pupils are equal, round, and reactive to light. No scleral icterus.  Neck: Neck supple. No JVD present. No thyromegaly present.  Cardiovascular: Normal rate, regular rhythm and normal heart sounds.   No murmur heard. Pulmonary/Chest: Effort normal and breath sounds normal. She has no wheezes.  Abdominal: Soft. Bowel sounds are normal. She exhibits no mass. There is no tenderness.  Genitourinary:       Deferred to GYN  Musculoskeletal: Normal range of motion. She exhibits no edema.  Lymphadenopathy:    She has no cervical adenopathy.  Neurological: She is alert and oriented to person, place, and time. No cranial nerve  deficit. Coordination normal.  Skin: Skin is warm and dry. No rash noted.  Psychiatric: Her behavior is normal. Judgment and thought content normal.          Assessment & Plan:  Obesity  Anxiety depression  History of iron deficiency anemia  History of vitamin D deficiency  History of hypertriglyceridemia  History of GE reflux  History of fluid retention   Return in 6 months for fasting lipid panel. Patient is on TriCor and Zetia. Continue with vitamin D supplementation. Had a Mirena IUD placed December 2011.

## 2011-09-20 ENCOUNTER — Telehealth: Payer: Self-pay | Admitting: Internal Medicine

## 2011-09-20 NOTE — Telephone Encounter (Signed)
Per [facility], since pt has not been seen in over 3 years she will be a new patient and must have been evaluated by PCP and have records that can be sent to their office for review before a visit can be scheduled.  Left voicemail for patient at work that she must schedule an appt with Dr. Lenord Fellers to be evaluated before we can refer to [facility].

## 2011-09-20 NOTE — Telephone Encounter (Signed)
Please call ENT and see if they can work her in. If it is going to be a while, we may need to see her. Does she have something to take for it?

## 2011-09-23 ENCOUNTER — Encounter: Payer: Self-pay | Admitting: Internal Medicine

## 2011-09-23 ENCOUNTER — Ambulatory Visit (INDEPENDENT_AMBULATORY_CARE_PROVIDER_SITE_OTHER): Payer: Federal, State, Local not specified - PPO | Admitting: Internal Medicine

## 2011-09-23 DIAGNOSIS — W57XXXA Bitten or stung by nonvenomous insect and other nonvenomous arthropods, initial encounter: Secondary | ICD-10-CM

## 2011-09-23 DIAGNOSIS — H8110 Benign paroxysmal vertigo, unspecified ear: Secondary | ICD-10-CM

## 2011-09-23 NOTE — Patient Instructions (Signed)
Take Xanax at bedtime as previously prescribed. Use triamcinolone cream 3 times daily on insect bites until healed

## 2011-09-23 NOTE — Progress Notes (Signed)
  Subjective:    Patient ID: Nancy Lawrence, female    DOB: 08-21-1961, 50 y.o.   MRN: [id number]  HPI patient is been having considerable problems with back pain. Has been getting epidural steroid injections per Dr. Ethelene Hal and going to physical therapy. While in physical therapy, she noticed if dorsiflexed her neck, she would have some vague sensation of vertigo. Has also noted some vertigo symptoms with turning over in bed for about a month. She had stopped taking Xanax since having some issues with her back. She has had vertigo in the past and has seen ENT physician at [facility]. Had had Epley maneuver in the past which helped vertigo.  Patient also has multiple insect bites on right lower extremity. Says these are very itchy and have not responded over-the-counter steroid cream.    Review of Systems     Objective:   Physical Exam PERRLA; TMs are clear; pharynx is clear; tongue is midline. No facial asymmetry. No nystagmus noted. Cranial nerves II through XII are grossly intact. Muscle strength is normal in the upper and lower extremities. Deep tendon reflexes 2+ and symmetrical in the upper and lower extremities. No carotid bruits. Cerebellar finger to nose testing is within normal limits. Gait is normal.        Assessment & Plan:  Benign positional vertigo  Multiple insect bites right leg    Plan: For vertigo, patient is to restart Xanax at bedtime which she has on hand at home. Is to call if not improved in 10 days or sooner if worse. For insect bites, prescribed triamcinolone cream 0.1% 60 g to use sparingly on bites 3 times a day until resolved.

## 2011-09-24 ENCOUNTER — Telehealth: Payer: Self-pay | Admitting: Internal Medicine

## 2011-09-24 MED ORDER — ALPRAZOLAM 0.5 MG PO TABS: 0.5000 mg | ORAL_TABLET | Freq: Two times a day (BID) | ORAL | Status: DC | PRN

## 2011-09-24 NOTE — Telephone Encounter (Signed)
RX  : Xanax 0.05mg  (#60) with 3 refills one po bid prn vertigo or anxiety.

## 2011-12-27 ENCOUNTER — Ambulatory Visit: Payer: Federal, State, Local not specified - PPO | Admitting: Internal Medicine

## 2012-01-07 ENCOUNTER — Encounter: Payer: Self-pay | Admitting: Internal Medicine

## 2012-01-07 ENCOUNTER — Ambulatory Visit (INDEPENDENT_AMBULATORY_CARE_PROVIDER_SITE_OTHER): Payer: Federal, State, Local not specified - PPO | Admitting: Internal Medicine

## 2012-01-07 DIAGNOSIS — M542 Cervicalgia: Secondary | ICD-10-CM

## 2012-01-07 NOTE — Patient Instructions (Signed)
Please try to watch diet and exercise more. Return in 6 months for physical exam. You have been given phentermine 37.5 mg daily for 30 days only. Please try to find copies of your MRI test results for [place] to review.

## 2012-01-07 NOTE — Progress Notes (Signed)
  Subjective:    Patient ID: Nancy Lawrence, female    DOB: 12/11/1960, 51 y.o.   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 She is on Saturday and TriCor. Unable to tolerate statin medications. History of deficiency anemia and vitamin D deficiency. History of GE reflux. Had colonoscopy by Dr. Leone Payor in 2006. Had Pap smear 05/09/2011.  History of cholecystectomy February 2007, umbilical hernia repair and lipoma removed 2000, pilonidal cyst excised Dr. Daphine Deutscher 1994. Patient had influenza immunization 2012. Tetanus immunization 2008.  Patient has been having considerable issues with her back. History of sciatica left buttock. Says she took prednisone in late December for back pain. She's seen Dr. Ethelene Hal and has had epidural steroid injections. More recently she's had cervical neck pain as well. Says she's had previous MRIs of her back and neck. We called [facility] per imaging and they have nothing on file. Says she may have been to try and imaging and she will check and try to get [place] those reports. Has been to chiropractor. Tried massage therapy. Not really tried physical therapy before.    Review of Systems     Objective:   Physical Exam she has some tenderness in left paracervical area. Deep tendon reflexes 2+ and symmetrical in the upper and lower cavities. Muscle strength normal in the upper and lower extremities. Straight leg raising is negative at 90 bilaterally.        Assessment & Plan:  Neck pain  Hyperlipidemia  Back pain with history of sciatica  Obesity  Depression/anxiety  Plan: Patient wants prescription for phentermine for weight loss gave her #3037.5 mg capsules with no refill. One by mouth daily. Explained to her we will not continue this beyond 12 weeks at most. Patient says she's not been eating well and has been on the road weekends with her daughters to play volleyball. Needs to watch her  diet and exercise. I am displeased with her lipid panel at present. She had a much better lipid panel in the summer of 2012. Return in 6 months for physical exam and fasting labs. Patient says she may want to see a neurosurgeon for further evaluation of neck and back pain.

## 2012-01-13 ENCOUNTER — Telehealth: Payer: Self-pay | Admitting: Internal Medicine

## 2012-01-13 NOTE — Telephone Encounter (Signed)
OK- but she was supposed to give me the name of whom she wanted to see.

## 2012-01-14 NOTE — Telephone Encounter (Signed)
Patient advised- referral will be sent to [facility]

## 2012-01-30 ENCOUNTER — Other Ambulatory Visit: Payer: Self-pay

## 2012-01-30 DIAGNOSIS — E785 Hyperlipidemia, unspecified: Secondary | ICD-10-CM

## 2012-01-30 MED ORDER — EZETIMIBE 10 MG PO TABS: 10.0000 mg | ORAL_TABLET | Freq: Every day | ORAL | Status: DC

## 2012-03-06 ENCOUNTER — Other Ambulatory Visit: Payer: Self-pay | Admitting: Neurosurgery

## 2012-03-06 DIAGNOSIS — M5137 Other intervertebral disc degeneration, lumbosacral region: Secondary | ICD-10-CM

## 2012-03-06 DIAGNOSIS — M47812 Spondylosis without myelopathy or radiculopathy, cervical region: Secondary | ICD-10-CM

## 2012-03-06 DIAGNOSIS — M545 Low back pain, unspecified: Secondary | ICD-10-CM

## 2012-03-06 DIAGNOSIS — M5030 Other cervical disc degeneration, unspecified cervical region: Secondary | ICD-10-CM

## 2012-03-06 DIAGNOSIS — M47817 Spondylosis without myelopathy or radiculopathy, lumbosacral region: Secondary | ICD-10-CM

## 2012-03-15 ENCOUNTER — Ambulatory Visit
Admission: RE | Admit: 2012-03-15 | Discharge: 2012-03-15 | Disposition: A | Discharge status: Home / Self Care | Payer: Federal, State, Local not specified - PPO | Source: Ambulatory Visit | Attending: Neurosurgery | Admitting: Neurosurgery

## 2012-03-15 DIAGNOSIS — M51379 Other intervertebral disc degeneration, lumbosacral region without mention of lumbar back pain or lower extremity pain: Secondary | ICD-10-CM

## 2012-03-15 DIAGNOSIS — M5137 Other intervertebral disc degeneration, lumbosacral region: Secondary | ICD-10-CM

## 2012-03-15 DIAGNOSIS — M545 Low back pain, unspecified: Secondary | ICD-10-CM

## 2012-03-15 DIAGNOSIS — M5030 Other cervical disc degeneration, unspecified cervical region: Secondary | ICD-10-CM

## 2012-03-15 DIAGNOSIS — M47812 Spondylosis without myelopathy or radiculopathy, cervical region: Secondary | ICD-10-CM

## 2012-03-15 DIAGNOSIS — M47817 Spondylosis without myelopathy or radiculopathy, lumbosacral region: Secondary | ICD-10-CM

## 2012-05-24 ENCOUNTER — Ambulatory Visit (HOSPITAL_COMMUNITY)
Admission: RE | Admit: 2012-05-24 | Discharge: 2012-05-24 | Disposition: A | Discharge status: Home / Self Care | Payer: Federal, State, Local not specified - PPO | Source: Ambulatory Visit | Attending: Family Medicine | Admitting: Family Medicine

## 2012-05-24 ENCOUNTER — Ambulatory Visit (INDEPENDENT_AMBULATORY_CARE_PROVIDER_SITE_OTHER): Payer: Federal, State, Local not specified - PPO | Admitting: Family Medicine

## 2012-05-24 ENCOUNTER — Ambulatory Visit (HOSPITAL_COMMUNITY)
Admission: EM | Admit: 2012-05-24 | Discharge: 2012-05-24 | Disposition: A | Discharge status: Home / Self Care | Payer: Federal, State, Local not specified - PPO | Source: Ambulatory Visit | Attending: Family Medicine | Admitting: Family Medicine

## 2012-05-24 VITALS — BP 128/72 | HR 66 | Temp 97.3°F | Resp 18 | Ht 66.0 in | Wt 195.0 lb

## 2012-05-24 DIAGNOSIS — R42 Dizziness and giddiness: Secondary | ICD-10-CM

## 2012-05-24 DIAGNOSIS — R110 Nausea: Secondary | ICD-10-CM

## 2012-05-24 DIAGNOSIS — R1110 Vomiting, unspecified: Secondary | ICD-10-CM

## 2012-05-24 LAB — POCT CBC
Granulocyte percent: 88.1 %G — AB (ref 37–80)
Hemoglobin: 14.6 g/dL (ref 12.2–16.2)
MCH, POC: 28.9 pg (ref 27–31.2)
MCV: 92.3 fL (ref 80–97)
MPV: 8.7 fL (ref 0–99.8)
RBC: 5.05 M/uL (ref 4.04–5.48)
WBC: 10.8 10*3/uL — AB (ref 4.6–10.2)

## 2012-05-24 LAB — POCT RAPID STREP A (OFFICE): Rapid Strep A Screen: NEGATIVE

## 2012-05-24 MED ORDER — DIPHENHYDRAMINE HCL 25 MG PO CAPS
25.0000 mg | ORAL_CAPSULE | Freq: Four times a day (QID) | ORAL | Status: DC | PRN
  Administered 2012-05-24: 25 mg via ORAL

## 2012-05-24 MED ORDER — ONDANSETRON 4 MG PO TBDP
8.0000 mg | ORAL_TABLET | Freq: Once | ORAL | Status: AC
  Administered 2012-05-24: 8 mg via ORAL

## 2012-05-24 MED ORDER — DIPHENHYDRAMINE HCL 25 MG PO CAPS: 25.0000 mg | ORAL_CAPSULE | Freq: Four times a day (QID) | ORAL | Status: DC | PRN

## 2012-05-24 MED ORDER — ONDANSETRON HCL 8 MG PO TABS: 8.0000 mg | ORAL_TABLET | Freq: Three times a day (TID) | ORAL | Status: DC | PRN

## 2012-05-24 MED ORDER — MECLIZINE HCL 25 MG PO TABS: 25.0000 mg | ORAL_TABLET | Freq: Three times a day (TID) | ORAL | Status: DC | PRN

## 2012-05-24 NOTE — Progress Notes (Signed)
Date:  05/24/2012   Name:  Nancy Lawrence   DOB:  [birth year]   MRN:  [id number]  PCP:  Margaree Mackintosh, MD    Chief Complaint: Dizziness, Nausea, Diarrhea and Emesis   History of Present Illness:  Nancy Lawrence is a 51 y.o. very pleasant female patient who presents with the following:  Yesterday am she awoke with a spinning feeling. She noted it as soon as she opened her eyes. She laid back down, but when she tried to get up again she felt nauseated.  She had to hold onto walls to walk around the house.    She had some vertigo in the past- last year.  She had used some xanax which did seem to help.  Her vertigo has never been this bad in the past.  Yesterday she took 2 of her 0.5mg  xanax pills.  She feels a little better when she lays down, and thought she was getting better last night.  However, this morning the vertigo had returned.    She has had vomiting with her vertigo. Yesterday she had several BM, but was not having diarrhea.  She has been able to eat and drink.  As long as she holds her head still she feels ok.  After moving her head her vertigo sensation will last for 2 or 3 minutes.  She does NOT have a headache  She has not taken any medications so far today.   She has suffered from vertigo a few times in the past, but never so severe.   No headache.  No hearing or vision changes, no tinnitus.  No injury or trauma.  Here today with her husband.  Has a Mirena IUD.  She is the Print production planner at [employer]  Patient Active Problem List  Diagnosis  . DYSLIPIDEMIA  . EXTERNAL HEMORRHOIDS  . ALLERGIC RHINITIS, SEASONAL  . DYSPEPSIA, CHRONIC  . HIATAL HERNIA  . DIVERTICULOSIS, COLON  . SLEEP APNEA  . EPIGASTRIC PAIN, CHRONIC  . ANEMIA, IRON DEFICIENCY, HX OF  . IRRITABLE BOWEL SYNDROME, HX OF  . HEADACHE, CHRONIC, HX OF  . Sciatica  . Fluid retention  . Obesity  . Anxiety  . Depression  . Vitamin D deficiency  . Benign positional vertigo  . Neck pain      Past Medical History  Diagnosis Date  . Hyperlipidemia   . GERD (gastroesophageal reflux disease)   . Vitamin d deficiency   . Anemia   . Fluid retention     Past Surgical History  Procedure Date  . Cholecystectomy   . Hernia repair     History  Substance Use Topics  . Smoking status: Former Smoker    Types: Cigarettes  . Smokeless tobacco: Never Used  . Alcohol Use: Yes     socially    No family history on file.  Allergies  Allergen Reactions  . Penicillins Rash  . Citalopram Hydrobromide     Medication list has been reviewed and updated.  Current Outpatient Prescriptions on File Prior to Visit  Medication Sig Dispense Refill  . ALPRAZolam (XANAX) 0.5 MG tablet Take 1 tablet (0.5 mg total) by mouth 2 (two) times daily as needed for sleep or anxiety (anxiety or vertigo).  60 tablet  3  . cetirizine (ZYRTEC) 10 MG tablet Take 10 mg by mouth daily.        Marland Kitchen estradiol (VIVELLE-DOT) 0.1 MG/24HR Place 1 patch onto the skin 2 (two) times a week.        Marland Kitchen  ezetimibe (ZETIA) 10 MG tablet Take 10 mg by mouth daily.        . furosemide (LASIX) 40 MG tablet Take 40 mg by mouth 2 (two) times daily.        Marland Kitchen ibuprofen (ADVIL,MOTRIN) 200 MG tablet Take 200 mg by mouth every 6 (six) hours as needed.        Marland Kitchen levonorgestrel (MIRENA) 20 MCG/24HR IUD 1 each by Intrauterine route once.        Marland Kitchen aspirin 81 MG tablet Take 81 mg by mouth daily.        . DULoxetine (CYMBALTA) 20 MG capsule Take 20 mg by mouth daily.        . ergocalciferol (VITAMIN D2) 50000 UNITS capsule Take 50,000 Units by mouth once a week.        . ezetimibe (ZETIA) 10 MG tablet Take 1 tablet (10 mg total) by mouth daily.  30 tablet  PRN  . fenofibrate (TRICOR) 145 MG tablet Take 1 tablet (145 mg total) by mouth daily.  30 tablet  11  . HYDROcodone-acetaminophen (VICODIN) 5-500 MG per tablet Take 1 tablet by mouth every 6 (six) hours as needed.        Marland Kitchen omeprazole (PRILOSEC) 20 MG capsule Take 20 mg by mouth  daily.        Marland Kitchen terbinafine (LAMISIL) 250 MG tablet Take 250 mg by mouth daily.          Review of Systems:  As per HPI- otherwise negative.   Physical Examination: Filed Vitals:   05/24/12 1328  BP: 128/72  Pulse: 66  Temp: 97.3 F (36.3 C)  Resp: 18   Filed Vitals:   05/24/12 1328  Height: 5\' 6"  (1.676 m)  Weight: 195 lb (88.451 kg)   Body mass index is 31.47 kg/(m^2). Ideal Body Weight: Weight in (lb) to have BMI = 25: 154.6   GEN: WDWN, NAD, Non-toxic, A & O x 3, overweight HEENT: Atraumatic, Normocephalic. Neck supple. No masses, No LAD.  PEERL, EOMI, oropharynx wnl.   Ears and Nose: No external deformity. CV: RRR, No M/G/R. No JVD. No thrill. No extra heart sounds. PULM: CTA B, no wheezes, crackles, rhonchi. No retractions. No resp. distress. No accessory muscle use. ABD: S, NT, ND, +BS. No rebound. No HSM. EXTR: No c/c/e NEURO unsteady gait.  Normal romberg.  Full strength and sensation in all extremities and face, normal DTR all extremities.,   PSYCH: Normally interactive. Conversant. Not depressed or anxious appearing.  Calm demeanor.  Treated with zofran 8mg  ODT and benadryl PO  Results for orders placed in visit on 05/24/12  POCT CBC      Component Value Range   WBC 10.8 (*) 4.6 - 10.2 K/uL   Lymph, poc 0.9  0.6 - 3.4   POC LYMPH PERCENT 8.5 (*) 10 - 50 %L   MID (cbc) 0.4  0 - 0.9   POC MID % 3.4  0 - 12 %M   POC Granulocyte 9.5 (*) 2 - 6.9   Granulocyte percent 88.1 (*) 37 - 80 %G   RBC 5.05  4.04 - 5.48 M/uL   Hemoglobin 14.6  12.2 - 16.2 g/dL   HCT, POC 16.1  09.6 - 47.9 %   MCV 92.3  80 - 97 fL   MCH, POC 28.9  27 - 31.2 pg   MCHC 31.3 (*) 31.8 - 35.4 g/dL   RDW, POC 04.5     Platelet Count, POC 324  142 -  424 K/uL   MPV 8.7  0 - 99.8 fL  GLUCOSE, POCT (MANUAL RESULT ENTRY)      Component Value Range   POC Glucose 103 (*) 70 - 99 mg/dl  POCT RAPID STREP A (OFFICE)      Component Value Range   Rapid Strep A Screen Negative  Negative    CT  HEAD WITHOUT CONTRAST  Technique: Contiguous axial images were obtained from the base of the skull through the vertex without contrast.  Comparison: Head CT 08/16/2005.  Findings: There is no evidence of acute intracranial hemorrhage, mass lesion, brain edema or extra-axial fluid collection. The ventricles and subarachnoid spaces are appropriately sized for age. There is no CT evidence of acute cortical infarction.  The visualized paranasal sinuses are clear. The calvarium is intact. The mastoids and middle ears are clear. The internal auditory canals appear symmetric.  IMPRESSION: Stable normal head CT.  Assessment and Plan: 1. Vertigo  diphenhydrAMINE (BENADRYL) capsule 25 mg, diphenhydrAMINE (BENADRYL) capsule 25 mg, POCT CBC, POCT glucose (manual entry), CT Head Wo Contrast, meclizine (ANTIVERT) 25 MG tablet, DISCONTINUED: diphenhydrAMINE (BENADRYL) 25 mg capsule, DISCONTINUED: diphenhydrAMINE (BENADRYL) 25 mg capsule  2. Nausea  ondansetron (ZOFRAN-ODT) disintegrating tablet 8 mg, POCT rapid strep A, ondansetron (ZOFRAN) 8 MG tablet  3. Vomiting  CT Head Wo Contrast, ondansetron (ZOFRAN) 8 MG tablet   Severe vertigo, ?BPPV.  Negative head CT is reassuring.  Called to discuss with patient.  We gave her more zofran (she received 8mg  at clinic as well as 50 mg of benadryl) and antivert.  She will call tomorrow if not doing better- she can call me sooner if she has any trouble overnight.    Abbe Amsterdam, MD

## 2012-05-25 ENCOUNTER — Other Ambulatory Visit: Payer: Self-pay

## 2012-05-25 ENCOUNTER — Telehealth: Payer: Self-pay

## 2012-05-25 DIAGNOSIS — I10 Essential (primary) hypertension: Secondary | ICD-10-CM

## 2012-05-25 MED ORDER — FENOFIBRATE 145 MG PO TABS: 145.0000 mg | ORAL_TABLET | Freq: Every day | ORAL | Status: DC

## 2012-05-25 NOTE — Telephone Encounter (Signed)
Pt called to report to Dr Patsy Lager who she saw yesterday that her CT was normal, but she still feels just as sick w/vertigo and nausea. She has taken two Antivert and a Zofran and she is still just able to stagger from the bathroom and back to bed. She didn't know if she should take a higher dose of either medication or go to an ENT or elsewhere? Please advise.

## 2012-05-25 NOTE — Telephone Encounter (Signed)
I called her back to check on her.  She still has no vomiting, only a mild MA.  However, if she tries to walk or get up she gets vertigo which causes nausea. She has been able to eat today.  Sounds well on the phone. Negative CT head yesterday.  She has some xanax at home- she will trying using this medication.  She is taking her antivert- has taken 2 today.  Cautioned that the combination of antivert and xanax  Could cause increased sleepiness.  If she is not better by tomorrow am she will RTC for a recheck. May need ENT evaluation soon.  If she feels worse or has a change in symptoms she will go to the ED tonight.

## 2012-05-25 NOTE — Telephone Encounter (Signed)
PT WANTED TO CALL AND GIVE A BETTER NUMBER TO REACH HER WHICH IS [phone]

## 2012-05-26 ENCOUNTER — Ambulatory Visit
Admission: RE | Admit: 2012-05-26 | Discharge: 2012-05-26 | Disposition: A | Discharge status: Home / Self Care | Payer: Federal, State, Local not specified - PPO | Source: Ambulatory Visit | Attending: Internal Medicine | Admitting: Internal Medicine

## 2012-05-26 ENCOUNTER — Encounter: Payer: Self-pay | Admitting: Internal Medicine

## 2012-05-26 ENCOUNTER — Ambulatory Visit (INDEPENDENT_AMBULATORY_CARE_PROVIDER_SITE_OTHER): Payer: Federal, State, Local not specified - PPO | Admitting: Internal Medicine

## 2012-05-26 VITALS — BP 136/92 | HR 72 | Temp 98.9°F

## 2012-05-26 DIAGNOSIS — R42 Dizziness and giddiness: Secondary | ICD-10-CM

## 2012-05-26 DIAGNOSIS — R110 Nausea: Secondary | ICD-10-CM

## 2012-05-26 DIAGNOSIS — H5500 Unspecified nystagmus: Secondary | ICD-10-CM

## 2012-05-26 DIAGNOSIS — H8110 Benign paroxysmal vertigo, unspecified ear: Secondary | ICD-10-CM

## 2012-05-26 DIAGNOSIS — Z0189 Encounter for other specified special examinations: Secondary | ICD-10-CM

## 2012-05-26 LAB — BASIC METABOLIC PANEL
BUN: 16 mg/dL (ref 6–23)
Potassium: 4.1 mEq/L (ref 3.5–5.3)
Sodium: 143 mEq/L (ref 135–145)

## 2012-05-26 MED ORDER — PROMETHAZINE HCL 25 MG/ML IJ SOLN
25.0000 mg | Freq: Once | INTRAMUSCULAR | Status: AC
  Administered 2012-05-26: 25 mg via INTRAMUSCULAR

## 2012-05-26 MED ORDER — GADOBENATE DIMEGLUMINE 529 MG/ML IV SOLN
18.0000 mL | Freq: Once | INTRAVENOUS | Status: AC | PRN
  Administered 2012-05-26: 18 mL via INTRAVENOUS

## 2012-05-26 NOTE — Patient Instructions (Addendum)
Use have been given injection of Phenergan IM today in the office. Take Xanax 1 mg-- one half tablet 3 times daily. Call with progress report in 24-48 hours. Your MRI of the brain with contrast is negative.

## 2012-06-07 NOTE — Progress Notes (Signed)
  Subjective:    Patient ID: Nancy Lawrence, female    DOB: [birth year], 51 y.o.   MRN: [id number]  HPI Patient awakened on July 13 with severe vertigo. Felt nauseated. Had to hold on to walls to get around the house. Has had vertigo previously and has used Xanax.   [facility] went to [facility]. Was treated with Zofran, Antivert and Benadryl. Patient says Benadryl it made her very drowsy. She had a CT scan without contrast on July 14 showing no evidence of hemorrhage. She has no fever. Blood pressure is normal. White blood cell count done on July 14 was 10,800. Hemoglobin was 14.4 g and platelet count 324,000. Glucose was 103. Rapid strep screen was negative. She appears to be groggy today and is walking down the hall holding on to walls here in the off this. Walking very slowly. Someone drove  her here today. She's worried that something is seriously wrong because this is the worst attack of vertigo ever.    Review of Systems     Objective:   Physical Exam cranial nerves II through XII are grossly intact. PERRLA. Funduscopic exam shows discs to be sharp and flat bilaterally with normal vasculature. Rightward nystagmus is present. No facial weakness. Tongue is midline. TMs are clear. Pharynx is clear. Neck is supple without adenopathy JVD or thyromegaly. Chest clear. Cardiac exam regular rate and rhythm. Patient moves all 4 extremities and muscle strength is 5 over 5 in all groups tested. Patient is ataxic due to vertigo and has to hold on to walls walking down the hall. She appears to be a bit drowsy perhaps from the Benadryl she has been taking. Finger to nose testing is normal.  Patient was sent for an emergency MRI of the brain with contrast which proved to be negative        Assessment & Plan:  Benign positional vertigo  Plan: Phenergan 25 mg IM given in the office. Patient should then proceed with Xanax 0.5 mg 3 times daily for vertigo. Expect symptoms to resolve within one  week. She will call if not better in 24-48 hours or sooner if worse for any reason.

## 2012-07-28 ENCOUNTER — Encounter: Payer: Self-pay | Admitting: Internal Medicine

## 2012-08-18 ENCOUNTER — Other Ambulatory Visit: Payer: Self-pay

## 2012-08-18 MED ORDER — ALPRAZOLAM 0.5 MG PO TABS: 0.5000 mg | ORAL_TABLET | Freq: Every evening | ORAL | Status: DC | PRN

## 2012-08-21 ENCOUNTER — Telehealth: Payer: Self-pay | Admitting: Internal Medicine

## 2012-08-21 MED ORDER — PREDNISONE 10 MG PO KIT: 10.0000 mg | PACK | Freq: Every day | ORAL | Status: DC

## 2012-08-21 NOTE — Telephone Encounter (Signed)
Call in Sterapred DS 10 mg 12 day dosepack. Take as directed.

## 2012-08-24 ENCOUNTER — Other Ambulatory Visit: Payer: Self-pay | Admitting: Obstetrics

## 2012-08-24 DIAGNOSIS — Z1231 Encounter for screening mammogram for malignant neoplasm of breast: Secondary | ICD-10-CM

## 2012-08-25 ENCOUNTER — Ambulatory Visit
Admission: RE | Admit: 2012-08-25 | Discharge: 2012-08-25 | Disposition: A | Discharge status: Home / Self Care | Payer: Federal, State, Local not specified - PPO | Source: Ambulatory Visit | Attending: Obstetrics | Admitting: Obstetrics

## 2012-08-25 DIAGNOSIS — Z1231 Encounter for screening mammogram for malignant neoplasm of breast: Secondary | ICD-10-CM

## 2012-08-27 ENCOUNTER — Other Ambulatory Visit: Payer: Self-pay | Admitting: Obstetrics

## 2012-08-27 DIAGNOSIS — R928 Other abnormal and inconclusive findings on diagnostic imaging of breast: Secondary | ICD-10-CM

## 2012-09-01 ENCOUNTER — Ambulatory Visit
Admission: RE | Admit: 2012-09-01 | Discharge: 2012-09-01 | Disposition: A | Discharge status: Home / Self Care | Payer: Federal, State, Local not specified - PPO | Source: Ambulatory Visit | Attending: Obstetrics | Admitting: Obstetrics

## 2012-09-01 DIAGNOSIS — R928 Other abnormal and inconclusive findings on diagnostic imaging of breast: Secondary | ICD-10-CM

## 2012-12-30 ENCOUNTER — Telehealth: Payer: Self-pay | Admitting: Internal Medicine

## 2012-12-30 NOTE — Telephone Encounter (Signed)
Pt had lumbar spine surgery by Dr. Newell Coral in December. Now c/o of difficulty walking and paresthesias in upper and lower extremities. She was advised to contact Dr. Newell Coral first regarding these symptoms. She may need to see Neurologist or Rheumatologist depending on what Dr. Newell Coral says. She wanted [place] to order lab through through her employer [facility] [employer] but needs to call Dr. Newell Coral before we procede with any evaluation.

## 2013-01-01 ENCOUNTER — Ambulatory Visit: Payer: Federal, State, Local not specified - PPO | Admitting: Internal Medicine

## 2013-02-04 ENCOUNTER — Other Ambulatory Visit: Payer: Self-pay | Admitting: Internal Medicine

## 2013-02-16 ENCOUNTER — Other Ambulatory Visit: Payer: Self-pay

## 2013-02-16 MED ORDER — ALPRAZOLAM 0.5 MG PO TABS: 0.5000 mg | ORAL_TABLET | Freq: Two times a day (BID) | ORAL | Status: DC | PRN

## 2013-03-11 ENCOUNTER — Encounter: Payer: Self-pay | Admitting: Internal Medicine

## 2013-07-02 ENCOUNTER — Telehealth: Payer: Self-pay | Admitting: Internal Medicine

## 2013-07-02 ENCOUNTER — Ambulatory Visit (INDEPENDENT_AMBULATORY_CARE_PROVIDER_SITE_OTHER): Payer: Federal, State, Local not specified - PPO | Admitting: Internal Medicine

## 2013-07-02 VITALS — BP 138/72 | HR 76 | Temp 99.1°F | Resp 16 | Ht 66.0 in | Wt 213.0 lb

## 2013-07-02 DIAGNOSIS — S91301A Unspecified open wound, right foot, initial encounter: Secondary | ICD-10-CM

## 2013-07-02 DIAGNOSIS — S91309A Unspecified open wound, unspecified foot, initial encounter: Secondary | ICD-10-CM

## 2013-07-02 MED ORDER — MUPIROCIN 2 % EX OINT: TOPICAL_OINTMENT | Freq: Three times a day (TID) | CUTANEOUS | Status: DC

## 2013-07-02 MED ORDER — DOXYCYCLINE HYCLATE 100 MG PO TABS: 100.0000 mg | ORAL_TABLET | Freq: Two times a day (BID) | ORAL | Status: DC

## 2013-07-02 NOTE — Progress Notes (Signed)
  Subjective:    Patient ID: Nancy Lawrence, female    DOB: 1961/01/29, 52 y.o.   MRN: [id number]  HPI Has 3 5d old abrasions, distal foot/. Slightly red and tender, wounds in healing phase. Tdap 6 yrs ago.  Walking full time on foot.   Review of Systems     Objective:   Physical Exam Wounds clean, superficial minimal tender. NMV intact  Clean, dress wounds     Assessment & Plan:  Wound /Early cellulitis Doxy/Mupirocin

## 2013-07-02 NOTE — Telephone Encounter (Signed)
Offered patient appointment for today at 4:15; patient states she has already contacted [facility] and they advise they're not too busy.  She is going to go to [facility]gent [facility] because she is scheduled off this afternoon and she needs to go to [facility] to take care of some things for her son.  She advised to thank you for offering to work her in.  She will instead opt to go to [facility].

## 2013-07-02 NOTE — Telephone Encounter (Signed)
Please work her in

## 2013-07-02 NOTE — Patient Instructions (Signed)
Wound Care Wound care helps prevent pain and infection.  You may need a tetanus shot if:  You cannot remember when you had your last tetanus shot.  You have never had a tetanus shot.  The injury broke your skin. If you need a tetanus shot and you choose not to have one, you may get tetanus. Sickness from tetanus can be serious. HOME CARE   Only take medicine as told by your doctor.  Clean the wound daily with mild soap and water.  Change any bandages (dressings) as told by your doctor.  Put medicated cream and a bandage on the wound as told by your doctor.  Change the bandage if it gets wet, dirty, or starts to smell.  Take showers. Do not take baths, swim, or do anything that puts your wound under water.  Rest and raise (elevate) the wound until the pain and puffiness (swelling) are better.  Keep all doctor visits as told. GET HELP RIGHT AWAY IF:   Yellowish-white fluid (pus) comes from the wound.  Medicine does not lessen your pain.  There is a red streak going away from the wound.  You have a fever. MAKE SURE YOU:   Understand these instructions.  Will watch your condition.  Will get help right away if you are not doing well or get worse. Document Released: 08/06/2008 Document Revised: 01/20/2012 Document Reviewed: 03/03/2011 [employer] Patient Information 2014 [employer].  

## 2013-07-14 ENCOUNTER — Other Ambulatory Visit: Payer: Self-pay | Admitting: Internal Medicine

## 2013-08-09 ENCOUNTER — Encounter: Payer: Self-pay | Admitting: Internal Medicine

## 2013-08-09 ENCOUNTER — Ambulatory Visit (INDEPENDENT_AMBULATORY_CARE_PROVIDER_SITE_OTHER): Payer: Federal, State, Local not specified - PPO | Admitting: Internal Medicine

## 2013-08-09 VITALS — BP 130/82 | Temp 98.7°F | Wt 208.0 lb

## 2013-08-09 DIAGNOSIS — R197 Diarrhea, unspecified: Secondary | ICD-10-CM

## 2013-08-09 MED ORDER — CIPROFLOXACIN HCL 500 MG PO TABS: 500.0000 mg | ORAL_TABLET | Freq: Two times a day (BID) | ORAL | Status: DC

## 2013-08-09 NOTE — Progress Notes (Signed)
  Subjective:    Patient ID: Nancy Lawrence, female    DOB: 16-Dec-1960, 52 y.o.   MRN: [id number]  HPI  Patient had onset Wednesday, September 24 of diarrhea occurring some tender 12 times a day. No blood in bowel movement. Abdomen felt crampy and gassy. She does have well water that she shares with other oral customers. No one else at home is sick. She has felt hot and cold as of yesterday but no documented fever. She eats out a lot. No recent travel history. 2 days ago she thought the diarrhea was slowing down but it has continued. Today she tried eating chicken and rice and had diarrhea shortly thereafter. No one at work admits to having acute gastroenteritis. No one in her family is sick.    Review of Systems     Objective:   Physical Exam abdomen is soft and  nondistended.        Assessment & Plan:  Diarrhea  Plan: This well could be a viral gastroenteritis such as Norovirus. However, patient eats out a lot and has potential for having a bacterial gastroenteritis. Records treat her with Cipro 500 mg twice daily for 3 days. She placed herself on clear liquids until diarrhea resolves and then advance diet slowly. She is to call if not better in one week or sooner if worse.

## 2013-08-09 NOTE — Patient Instructions (Addendum)
Take Cipro 500 mg twice daily for 3 days. Stay on clear liquids and advance diet slowly. Call if not better in one week or sooner if worse.

## 2013-11-29 ENCOUNTER — Other Ambulatory Visit: Payer: Self-pay

## 2013-11-29 DIAGNOSIS — Z1231 Encounter for screening mammogram for malignant neoplasm of breast: Secondary | ICD-10-CM

## 2013-12-16 ENCOUNTER — Ambulatory Visit
Admission: RE | Admit: 2013-12-16 | Discharge: 2013-12-16 | Disposition: A | Discharge status: Home / Self Care | Payer: Federal, State, Local not specified - PPO | Source: Ambulatory Visit

## 2013-12-16 DIAGNOSIS — Z1231 Encounter for screening mammogram for malignant neoplasm of breast: Secondary | ICD-10-CM

## 2014-04-01 ENCOUNTER — Other Ambulatory Visit: Payer: Self-pay

## 2014-04-01 MED ORDER — EZETIMIBE 10 MG PO TABS: 10.0000 mg | ORAL_TABLET | Freq: Every day | ORAL | Status: DC

## 2014-04-01 MED ORDER — FUROSEMIDE 40 MG PO TABS: 40.0000 mg | ORAL_TABLET | Freq: Two times a day (BID) | ORAL | Status: DC | PRN

## 2014-04-01 MED ORDER — FENOFIBRATE 145 MG PO TABS
145.0000 mg | ORAL_TABLET | Freq: Every day | ORAL | Status: DC
Start: 2014-04-01 — End: 2014-11-14

## 2014-06-12 ENCOUNTER — Ambulatory Visit (INDEPENDENT_AMBULATORY_CARE_PROVIDER_SITE_OTHER): Payer: Federal, State, Local not specified - PPO | Admitting: Family Medicine

## 2014-06-12 VITALS — BP 126/70 | HR 68 | Temp 97.8°F | Resp 16 | Ht 66.5 in | Wt 214.8 lb

## 2014-06-12 DIAGNOSIS — K130 Diseases of lips: Secondary | ICD-10-CM

## 2014-06-12 DIAGNOSIS — R220 Localized swelling, mass and lump, head: Secondary | ICD-10-CM

## 2014-06-12 DIAGNOSIS — Z8619 Personal history of other infectious and parasitic diseases: Secondary | ICD-10-CM

## 2014-06-12 MED ORDER — VALACYCLOVIR HCL 1 G PO TABS: ORAL_TABLET | ORAL | Status: DC

## 2014-06-12 NOTE — Progress Notes (Signed)
This chart was scribed for Merri Ray, MD by Thea Alken, ED Scribe. This patient was seen in room 2 and the patient's care was started at 12:42 PM. Subjective:    Patient ID: Nancy Lawrence, female    DOB: 1961/07/07, 53 y.o.   MRN: [id number]  HPI Chief Complaint  Patient presents with  . Other    woke up with swollen lips   HPI Comments: Nancy Lawrence is a 53 y.o. female who presents to the [facility] complaining of and edematous lower lips. Pt reports she went bed 11:30 PM last night and woke up 5 AM with a protruding lower lips. Pt states she took her last 1 1g valtrex and applied ice this morning with relief to swelling. At this time pt states the swelling has subsided but has a raw, tingling feeling to inside of lower lip left greater than right. She denies new food, medication and dermatologic products. She reports eating at a buffet style cookout yesterday consisting of burgers, potato salad, etc.  She reports she took her usual zyrtec and ibuprofen last night before bed. Pt denies similar symptom in the past. Pt has h/o fever blister, her last one being 1 year ago. Pt denies fever, chills, trouble swallowing and trouble breathing.  Patient Active Problem List   Diagnosis Date Noted  . Neck pain 01/07/2012  . Benign positional vertigo 09/23/2011  . Fluid retention 08/12/2011  . Obesity 08/12/2011  . Anxiety 08/12/2011  . Depression 08/12/2011  . Vitamin D deficiency 08/12/2011  . Sciatica 08/06/2011  . DYSLIPIDEMIA 03/16/2008  . ALLERGIC RHINITIS, SEASONAL 03/16/2008  . SLEEP APNEA 03/16/2008  . ANEMIA, IRON DEFICIENCY, HX OF 03/16/2008  . IRRITABLE BOWEL SYNDROME, HX OF 03/16/2008  . HEADACHE, CHRONIC, HX OF 03/16/2008  . DYSPEPSIA, CHRONIC 02/17/2008  . EPIGASTRIC PAIN, CHRONIC 02/17/2008  . HIATAL HERNIA 02/25/2007  . EXTERNAL HEMORRHOIDS 08/19/2005  . DIVERTICULOSIS, COLON 08/19/2005   Past Medical History  Diagnosis Date  .  Hyperlipidemia   . GERD (gastroesophageal reflux disease)   . Vitamin D deficiency   . Anemia   . Fluid retention   . Allergy    Past Surgical History  Procedure Laterality Date  . Cholecystectomy    . Hernia repair     Allergies  Allergen Reactions  . Penicillins Rash  . Citalopram Hydrobromide    Prior to Admission medications   Medication Sig Start Date End Date Taking? Authorizing Provider  cetirizine (ZYRTEC) 10 MG tablet Take 10 mg by mouth daily.     Yes Historical Provider, MD  ezetimibe (ZETIA) 10 MG tablet Take 1 tablet (10 mg total) by mouth daily. 04/01/14  Yes Elby Showers, MD  fenofibrate (TRICOR) 145 MG tablet Take 1 tablet (145 mg total) by mouth daily. 04/01/14  Yes Elby Showers, MD  furosemide (LASIX) 40 MG tablet Take 1 tablet (40 mg total) by mouth 2 (two) times daily as needed. 04/01/14  Yes Elby Showers, MD  ibuprofen (ADVIL,MOTRIN) 200 MG tablet Take 200 mg by mouth every 6 (six) hours as needed.     Yes Historical Provider, MD  levonorgestrel (MIRENA) 20 MCG/24HR IUD 1 each by Intrauterine route once.     Yes Historical Provider, MD  omeprazole (PRILOSEC) 20 MG capsule Take 20 mg by mouth daily.     Yes Historical Provider, MD  ALPRAZolam Duanne Moron) 0.5 MG tablet Take 1 tablet (0.5 mg total) by mouth 2 (two) times daily as  needed for sleep. 02/16/13   Elby Showers, MD  aspirin 81 MG tablet Take 81 mg by mouth daily.      Historical Provider, MD   History   Social History  . Marital Status: Married    Spouse Name: N/A    Number of Children: N/A  . Years of Education: N/A   Occupational History  . Not on file.   Social History Main Topics  . Smoking status: Former Smoker    Types: Cigarettes  . Smokeless tobacco: Never Used  . Alcohol Use: Yes     Comment: socially  . Drug Use: No  . Sexual Activity: Not on file   Other Topics Concern  . Not on file   Social History Narrative  . No narrative on file    Review of Systems  Constitutional:  Negative for fever and chills.  HENT: Negative for trouble swallowing.   Respiratory: Negative for apnea and shortness of breath.    Objective:   Physical Exam  Vitals reviewed. Constitutional: She is oriented to person, place, and time. She appears well-developed and well-nourished. No distress.  HENT:  Head: Normocephalic and atraumatic.  Right Ear: Hearing, tympanic membrane, external ear and ear canal normal.  Left Ear: Hearing, tympanic membrane, external ear and ear canal normal.  Nose: Nose normal.  Mouth/Throat: Oropharynx is clear and moist. No oropharyngeal exudate.  1 small vesicular/ulcer area on inside of left lower lip without discharge and slight swelling of lower lip left greater than right.  Eyes: Conjunctivae and EOM are normal. Pupils are equal, round, and reactive to light.  Cardiovascular: Normal rate, regular rhythm, normal heart sounds and intact distal pulses.  Exam reveals no gallop and no friction rub.   No murmur heard. No stridor  Pulmonary/Chest: Effort normal and breath sounds normal. No respiratory distress. She has no wheezes. She has no rhonchi.  Neurological: She is alert and oriented to person, place, and time.  Skin: Skin is warm and dry. No rash noted. Rash is not urticarial.  Psychiatric: She has a normal mood and affect. Her behavior is normal.   No rash, no urticaria.   Filed Vitals:   06/12/14 1122  BP: 126/70  Pulse: 68  Temp: 97.8 F (36.6 C)  TempSrc: Oral  Resp: 16  Height: 5' 6.5" (1.689 m)  Weight: 214 lb 12.8 oz (97.433 kg)  SpO2: 98%   Assessment & Plan:   Nancy Lawrence is a 53 y.o. 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 Angioedema, vs allergic reaction. Not on ACE-I.   Has seen some improvement already today and no other affected areas. Can take full dose of Valtrex, sx care with benadryl, and rtc if not continuing to improve.  If recurrent - consider allergist eval, or discuss  further with PCP.    Meds ordered this encounter  Medications  . valACYclovir (VALTREX) 1000 MG tablet    Sig: Take 2 tabs by mouth at onset of symptoms and repeat dose once in 12 hours.    Dispense:  28 tablet    Refill:  0   Patient Instructions  Your lip swelling may be due to a recurrence of the cold sore virus or possible reaction to food or other environmental allergen. As it is improving, can take benadryl 25mg  - 1 to 2 every 4 to 6 hours as needed, valtrex as discussed, and if not continuing to improve in next few days -  return here or your primary provider for recheck.  If lip swelling recurs - consider evaluation with allergist, but can be discussed with your primary provider.  Return to the clinic or go to the nearest emergency room if any of your symptoms worsen or new symptoms occur.    I personally performed the services described in this documentation, which was scribed in my presence. The recorded information has been reviewed and considered, and addended by me as needed.

## 2014-06-12 NOTE — Patient Instructions (Signed)
Your lip swelling may be due to a recurrence of the cold sore virus or possible reaction to food or other environmental allergen. As it is improving, can take benadryl 25mg  - 1 to 2 every 4 to 6 hours as needed, valtrex as discussed, and if not continuing to improve in next few days - return here or your primary provider for recheck.  If lip swelling recurs - consider evaluation with allergist, but can be discussed with your primary provider.  Return to the clinic or go to the nearest emergency room if any of your symptoms worsen or new symptoms occur.

## 2014-11-14 ENCOUNTER — Ambulatory Visit (INDEPENDENT_AMBULATORY_CARE_PROVIDER_SITE_OTHER): Payer: Federal, State, Local not specified - PPO | Admitting: Internal Medicine

## 2014-11-14 ENCOUNTER — Encounter: Payer: Self-pay | Admitting: Internal Medicine

## 2014-11-14 VITALS — BP 116/72 | HR 69 | Temp 98.5°F | Wt 208.0 lb

## 2014-11-14 DIAGNOSIS — N6320 Unspecified lump in the left breast, unspecified quadrant: Secondary | ICD-10-CM

## 2014-11-14 DIAGNOSIS — E781 Pure hyperglyceridemia: Secondary | ICD-10-CM

## 2014-11-14 DIAGNOSIS — Z8669 Personal history of other diseases of the nervous system and sense organs: Secondary | ICD-10-CM

## 2014-11-14 DIAGNOSIS — L0293 Carbuncle, unspecified: Secondary | ICD-10-CM

## 2014-11-14 DIAGNOSIS — Z87898 Personal history of other specified conditions: Secondary | ICD-10-CM

## 2014-11-14 DIAGNOSIS — K219 Gastro-esophageal reflux disease without esophagitis: Secondary | ICD-10-CM

## 2014-11-14 DIAGNOSIS — N63 Unspecified lump in breast: Secondary | ICD-10-CM

## 2014-11-14 DIAGNOSIS — F411 Generalized anxiety disorder: Secondary | ICD-10-CM

## 2014-11-14 DIAGNOSIS — Z0000 Encounter for general adult medical examination without abnormal findings: Secondary | ICD-10-CM

## 2014-11-14 DIAGNOSIS — R609 Edema, unspecified: Secondary | ICD-10-CM

## 2014-11-14 DIAGNOSIS — E559 Vitamin D deficiency, unspecified: Secondary | ICD-10-CM

## 2014-11-14 LAB — POCT URINALYSIS DIPSTICK
BILIRUBIN UA: NEGATIVE
Blood, UA: NEGATIVE
Glucose, UA: NEGATIVE
KETONES UA: NEGATIVE
Leukocytes, UA: NEGATIVE
Nitrite, UA: NEGATIVE
PROTEIN UA: NEGATIVE
Spec Grav, UA: 1.015
Urobilinogen, UA: NEGATIVE
pH, UA: 5

## 2014-11-14 MED ORDER — FENOFIBRATE 145 MG PO TABS: 145.0000 mg | ORAL_TABLET | Freq: Every day | ORAL | Status: DC

## 2014-11-14 MED ORDER — ALPRAZOLAM 0.5 MG PO TABS: 0.5000 mg | ORAL_TABLET | Freq: Two times a day (BID) | ORAL | Status: DC | PRN

## 2014-11-14 MED ORDER — MUPIROCIN 2 % EX OINT: 1.0000 "application " | TOPICAL_OINTMENT | Freq: Two times a day (BID) | CUTANEOUS | Status: DC

## 2014-11-14 MED ORDER — OMEPRAZOLE 20 MG PO CPDR: 20.0000 mg | DELAYED_RELEASE_CAPSULE | Freq: Every day | ORAL | Status: DC

## 2014-11-14 MED ORDER — DOXYCYCLINE HYCLATE 100 MG PO TABS: 100.0000 mg | ORAL_TABLET | Freq: Two times a day (BID) | ORAL | Status: DC

## 2014-11-14 MED ORDER — ERGOCALCIFEROL 1.25 MG (50000 UT) PO CAPS: ORAL_CAPSULE | ORAL | Status: DC

## 2014-11-14 MED ORDER — EZETIMIBE 10 MG PO TABS
10.0000 mg | ORAL_TABLET | Freq: Every day | ORAL | Status: DC
Start: 2014-11-14 — End: 2015-03-01

## 2014-11-14 NOTE — Patient Instructions (Addendum)
Take Phentermine daily ( #100) with no refill. Take Doxycycline 100 mg twice daily x 3 weeks. Uses bactroban in nostrils nightly. Bathe in Hibiclens daily x 6 months.Drisdol 50,000 units weekly x 12 weeks then 2000 units daily. Have lipids repeated in 3 months. Diet and exercise. Have Diagnostic mammogram

## 2014-11-14 NOTE — Progress Notes (Addendum)
Subjective:    Patient ID: Nancy Lawrence, female    DOB: [birth year], 54 y.o.   MRN: [id number]  HPI 54 year old  White  Female for health maintenance and evaluation of medical issues. Not exercising. Displeased with her weight. History of hypertriglyceridemia, fluid retention, iron deficiency anemia, vitamin D deficiency, GE reflux. History of stress and anxiety.  She is allergic to Penicillin and has had possible adverse reaction to Crestor and Celexa.  Weighed 208 pounds in 2014  Tetanus immunization 2008. Colonoscopy 2006. Cholecystectomy 2007. Umbilical hernia repair and lipoma removal around 2000. Pilonidal cyst excised 1994. Pneumonia approximately year 2000.  SHx: Married.  Has 2 daughters in good health. Husband is a Therapist, sports carrier.   She is Glass blower/designer for [employer]. Completed 2 years of college. Does not smoke. Social alcohol consumption. Stays busy with children's activities.  FHx: Father with DM, heart disease, hyperlipidemia, MI, HTN. Paternal uncle with MI. 2 brothers with HTN, DM, Hyperlipidemia. No sisters.  Patient recently treated OB/GYN office for carbuncle. Still present.  Labs done through [facility] show hemoglobin A1c 5.2%. TSH 3.07, vitamin D 13.5, total cholesterol 176, triglycerides 265, HDL cholesterol 32, LDL cholesterol 91. BUN and creatinine are normal. Liver functions normal. Serum glucose 103. Liver functions normal.  Review of Systems  Constitutional: Positive for fatigue.  HENT: Negative.   Eyes: Negative.   Respiratory: Negative.   Cardiovascular: Negative.   Gastrointestinal:       History of GERD  Endocrine: Negative.   Genitourinary: Negative.   Neurological: Negative.   Psychiatric/Behavioral:       Anxiety       Objective:   Physical Exam  Constitutional: She is oriented to person, place, and time. She appears well-developed and well-nourished.  HENT:  Head: Normocephalic and atraumatic.  Right Ear: External ear  normal.  Left Ear: External ear normal.  Mouth/Throat: Oropharynx is clear and moist. No oropharyngeal exudate.  Eyes: Conjunctivae and EOM are normal. Pupils are equal, round, and reactive to light. Left eye exhibits no discharge.  Neck: Neck supple. No JVD present. No thyromegaly present.  Cardiovascular: Normal rate, regular rhythm, normal heart sounds and intact distal pulses.   No murmur heard. Pulmonary/Chest: Effort normal and breath sounds normal. No respiratory distress. She has no wheezes. She has no rales. She exhibits no tenderness.  Breasts possible mass lower inner quadrant left breast:  Abdominal: Soft. Bowel sounds are normal. She exhibits no distension and no mass. There is no tenderness. There is no rebound and no guarding.  Genitourinary:  Deferred to GYN physician  Musculoskeletal: Normal range of motion. She exhibits no edema.  Lymphadenopathy:    She has no cervical adenopathy.  Neurological: She is alert and oriented to person, place, and time. She has normal reflexes. She displays normal reflexes. No cranial nerve deficit. Coordination normal.  Skin: Skin is warm and dry. No rash noted.  Psychiatric: She has a normal mood and affect. Her behavior is normal. Judgment and thought content normal.  Vitals reviewed.         Assessment & Plan:  Obesity-given phentermine 37.5 mg daily for 90 days  Hypertriglyceridemia--to have repeat lipid panel in 3 months  History of fluid retention treated with diuretic  History of anxiety  Possible mass left breast--- to have diagnostic mammogram  Vitamin D deficiency-needs Drisdol 50,000 units weekly for 12 weeks then 2000 units daily  GE reflux-continue PPI  Carbuncle-use Bactroban in nostrils nightly. Take doxycycline 100  mg twice daily for 3 weeks and bathe in Hibiclens for 6 months. 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

## 2014-11-15 ENCOUNTER — Other Ambulatory Visit: Payer: Self-pay | Admitting: Internal Medicine

## 2014-11-15 DIAGNOSIS — N6320 Unspecified lump in the left breast, unspecified quadrant: Secondary | ICD-10-CM

## 2014-11-16 ENCOUNTER — Ambulatory Visit
Admission: RE | Admit: 2014-11-16 | Discharge: 2014-11-16 | Disposition: A | Discharge status: Home / Self Care | Payer: Federal, State, Local not specified - PPO | Source: Ambulatory Visit | Attending: Internal Medicine | Admitting: Internal Medicine

## 2014-11-16 DIAGNOSIS — N6320 Unspecified lump in the left breast, unspecified quadrant: Secondary | ICD-10-CM

## 2014-11-18 ENCOUNTER — Other Ambulatory Visit: Payer: Self-pay | Admitting: Dermatology

## 2014-12-10 ENCOUNTER — Encounter: Payer: Self-pay | Admitting: Internal Medicine

## 2015-03-01 ENCOUNTER — Other Ambulatory Visit: Payer: Self-pay | Admitting: Internal Medicine

## 2015-03-02 ENCOUNTER — Other Ambulatory Visit: Payer: Self-pay | Admitting: *Deleted

## 2015-03-02 MED ORDER — EZETIMIBE 10 MG PO TABS: ORAL_TABLET | ORAL | Status: DC

## 2015-03-02 NOTE — Telephone Encounter (Signed)
Sent refill on zetia

## 2015-03-29 ENCOUNTER — Other Ambulatory Visit: Payer: Self-pay | Admitting: Internal Medicine

## 2015-05-10 ENCOUNTER — Other Ambulatory Visit: Payer: Self-pay | Admitting: Internal Medicine

## 2015-08-03 ENCOUNTER — Telehealth: Payer: Self-pay | Admitting: Internal Medicine

## 2015-08-03 NOTE — Telephone Encounter (Signed)
Urgent Care or Dr. Ernesto Rutherford or Dr. Lucia Gaskins.

## 2015-08-03 NOTE — Telephone Encounter (Signed)
Spoke with patient she states she will go to Urgent Care. Patient states she doesn't think she can get in to see ENT today.

## 2015-08-03 NOTE — Telephone Encounter (Signed)
Patient calls stating she has "wax on her ear drum".  Has attempted to get this out with 4 ear wicks and peroxide and still no help.  Advised patient that we had no availability today.  Patient states she's going out of town in the morning.  Advised I would speak with Dr. Renold Genta, but she may have to go to Urgent Care.  She asked me to still speak with Dr. Renold Genta.     Please advise.

## 2015-10-20 ENCOUNTER — Ambulatory Visit (INDEPENDENT_AMBULATORY_CARE_PROVIDER_SITE_OTHER): Payer: Federal, State, Local not specified - PPO

## 2015-10-20 ENCOUNTER — Encounter: Payer: Self-pay | Admitting: Family Medicine

## 2015-10-20 ENCOUNTER — Ambulatory Visit (INDEPENDENT_AMBULATORY_CARE_PROVIDER_SITE_OTHER): Payer: Federal, State, Local not specified - PPO | Admitting: Family Medicine

## 2015-10-20 VITALS — BP 117/75 | HR 67 | Temp 98.1°F

## 2015-10-20 DIAGNOSIS — R0602 Shortness of breath: Secondary | ICD-10-CM

## 2015-10-20 MED ORDER — AMOXICILLIN-POT CLAVULANATE 875-125 MG PO TABS: 1.0000 | ORAL_TABLET | Freq: Two times a day (BID) | ORAL | Status: DC

## 2015-10-20 NOTE — Progress Notes (Signed)
Subjective:    Patient ID: Nancy Lawrence, female    DOB: March 10, 1961, 54 y.o.   MRN: [id number]  HPI 54 year old female who comes in with greater than one week history of congestion. She has had cough headache sore throat tender lymph nodes. Today she hurts some wheezing. She is requesting a chest x-ray. She has been treated with Keflex by a mid-level provider and the office she works at [employer].  Patient Active Problem List   Diagnosis Date Noted  . Neck pain 01/07/2012  . Benign positional vertigo 09/23/2011  . Fluid retention 08/12/2011  . Obesity 08/12/2011  . Anxiety 08/12/2011  . Depression 08/12/2011  . Vitamin D deficiency 08/12/2011  . Sciatica 08/06/2011  . DYSLIPIDEMIA 03/16/2008  . ALLERGIC RHINITIS, SEASONAL 03/16/2008  . SLEEP APNEA 03/16/2008  . ANEMIA, IRON DEFICIENCY, HX OF 03/16/2008  . IRRITABLE BOWEL SYNDROME, HX OF 03/16/2008  . HEADACHE, CHRONIC, HX OF 03/16/2008  . DYSPEPSIA, CHRONIC 02/17/2008  . EPIGASTRIC PAIN, CHRONIC 02/17/2008  . HIATAL HERNIA 02/25/2007  . EXTERNAL HEMORRHOIDS 08/19/2005  . DIVERTICULOSIS, COLON 08/19/2005   Outpatient Encounter Prescriptions as of 10/20/2015  Medication Sig  . aspirin 81 MG tablet Take 81 mg by mouth daily.    Marland Kitchen ezetimibe (ZETIA) 10 MG tablet TAKE 1 TABLET(S) BY MOUTH DAILY  . fenofibrate (TRICOR) 145 MG tablet Take 1 tablet (145 mg total) by mouth daily.  . furosemide (LASIX) 40 MG tablet TAKE 1 TABLET(S) BY MOUTH TWICE DAILY AS NEEDED  . ibuprofen (ADVIL,MOTRIN) 200 MG tablet Take 200 mg by mouth every 6 (six) hours as needed.    Marland Kitchen levonorgestrel (MIRENA) 20 MCG/24HR IUD 1 each by Intrauterine route once.    Marland Kitchen omeprazole (PRILOSEC) 20 MG capsule Take 1 capsule (20 mg total) by mouth daily.  Marland Kitchen ALPRAZolam (XANAX) 0.5 MG tablet Take 1 tablet (0.5 mg total) by mouth 2 (two) times daily as needed for sleep. (Patient not taking: Reported on 10/20/2015)  . cetirizine (ZYRTEC) 10 MG tablet Take 10 mg by  mouth daily.    Marland Kitchen dicyclomine (BENTYL) 20 MG tablet   . ergocalciferol (DRISDOL) 50000 UNITS capsule One capsule weekly for 12 weeks then take Over the counter Vitamin D3 2000 units daily (Patient not taking: Reported on 10/20/2015)  . mupirocin ointment (BACTROBAN) 2 % Place 1 application into the nose 2 (two) times daily. (Patient not taking: Reported on 10/20/2015)  . valACYclovir (VALTREX) 1000 MG tablet Take 2 tabs by mouth at onset of symptoms and repeat dose once in 12 hours. (Patient not taking: Reported on 10/20/2015)  . [DISCONTINUED] doxycycline (VIBRA-TABS) 100 MG tablet Take 1 tablet (100 mg total) by mouth 2 (two) times daily.   No facility-administered encounter medications on file as of 10/20/2015.      Review of Systems  Constitutional: Negative.   HENT: Positive for congestion and postnasal drip.   Respiratory: Positive for cough.   Cardiovascular: Negative.   Neurological: Negative.        Objective:   Physical Exam  Constitutional: She is oriented to person, place, and time. She appears well-developed and well-nourished.  HENT:  Head: Normocephalic.  Right Ear: External ear normal.  Left Ear: External ear normal.  Mouth/Throat: Oropharynx is clear and moist.  Sinuses are tender to percussion.  Cardiovascular: Normal rate and regular rhythm.   Pulmonary/Chest: Effort normal. She has wheezes.  Neurological: She is alert and oriented to person, place, and time.  Assessment & Plan:  1. SOB (shortness of breath) Chest x-ray is clear to my exam. I suspect she has sinobronchitis. I would like to treat this with Augmentin 875 twice a day. She has had prolonged symptoms which are consistent I think. She's taken several over-the-counter medicines including some medicines which may offset one another such as any histamine with Mucinex. I've asked her to discontinue the antihistamine and take Mucinex DM along with the Augmentin. Also instructed her to drink plenty  of fluids.    Wardell Honour MD - DG Chest 2 View; Future

## 2015-11-28 ENCOUNTER — Other Ambulatory Visit: Payer: Self-pay | Admitting: Internal Medicine

## 2015-11-28 ENCOUNTER — Other Ambulatory Visit: Payer: Self-pay

## 2015-11-28 DIAGNOSIS — Z1231 Encounter for screening mammogram for malignant neoplasm of breast: Secondary | ICD-10-CM

## 2015-12-02 ENCOUNTER — Other Ambulatory Visit: Payer: Self-pay | Admitting: Internal Medicine

## 2015-12-08 ENCOUNTER — Encounter: Payer: Federal, State, Local not specified - PPO | Admitting: Internal Medicine

## 2015-12-12 ENCOUNTER — Ambulatory Visit (INDEPENDENT_AMBULATORY_CARE_PROVIDER_SITE_OTHER): Payer: Federal, State, Local not specified - PPO | Admitting: Internal Medicine

## 2015-12-12 ENCOUNTER — Encounter: Payer: Self-pay | Admitting: Internal Medicine

## 2015-12-12 VITALS — BP 108/82 | HR 76 | Temp 97.9°F | Resp 20 | Ht 67.0 in | Wt 219.0 lb

## 2015-12-12 DIAGNOSIS — R609 Edema, unspecified: Secondary | ICD-10-CM | POA: Diagnosis not present

## 2015-12-12 DIAGNOSIS — E669 Obesity, unspecified: Secondary | ICD-10-CM | POA: Diagnosis not present

## 2015-12-12 DIAGNOSIS — E785 Hyperlipidemia, unspecified: Secondary | ICD-10-CM | POA: Diagnosis not present

## 2015-12-12 DIAGNOSIS — Z0000 Encounter for general adult medical examination without abnormal findings: Secondary | ICD-10-CM | POA: Diagnosis not present

## 2015-12-12 DIAGNOSIS — F439 Reaction to severe stress, unspecified: Secondary | ICD-10-CM

## 2015-12-12 DIAGNOSIS — F411 Generalized anxiety disorder: Secondary | ICD-10-CM

## 2015-12-12 DIAGNOSIS — K219 Gastro-esophageal reflux disease without esophagitis: Secondary | ICD-10-CM

## 2015-12-12 DIAGNOSIS — R7302 Impaired glucose tolerance (oral): Secondary | ICD-10-CM

## 2015-12-12 DIAGNOSIS — Z658 Other specified problems related to psychosocial circumstances: Secondary | ICD-10-CM | POA: Diagnosis not present

## 2015-12-12 DIAGNOSIS — E559 Vitamin D deficiency, unspecified: Secondary | ICD-10-CM

## 2015-12-12 MED ORDER — EZETIMIBE 10 MG PO TABS: ORAL_TABLET | ORAL | Status: DC

## 2015-12-12 MED ORDER — FENOFIBRATE 145 MG PO TABS: 145.0000 mg | ORAL_TABLET | Freq: Every day | ORAL | Status: DC

## 2015-12-12 MED ORDER — OMEPRAZOLE 20 MG PO CPDR: 20.0000 mg | DELAYED_RELEASE_CAPSULE | Freq: Every day | ORAL | Status: DC

## 2015-12-12 MED ORDER — FUROSEMIDE 40 MG PO TABS: ORAL_TABLET | ORAL | Status: DC

## 2015-12-12 MED ORDER — ALPRAZOLAM 0.5 MG PO TABS: 0.5000 mg | ORAL_TABLET | Freq: Two times a day (BID) | ORAL | Status: DC | PRN

## 2015-12-12 MED ORDER — VITAMIN D (ERGOCALCIFEROL) 1.25 MG (50000 UNIT) PO CAPS: 50000.0000 [IU] | ORAL_CAPSULE | ORAL | Status: DC

## 2015-12-12 NOTE — Patient Instructions (Signed)
Encouraged diet exercise and weight loss. Continue same medications as previously prescribed. Take high-dose vitamin D 50,000 units weekly for 12 weeks and 2000 units vitamin D 3 daily. Return in 6 months. It was a pleasure to see you today.

## 2015-12-12 NOTE — Progress Notes (Signed)
Subjective:    Patient ID: Nancy Lawrence, female    DOB: 1961-07-27, 55 y.o.   MRN: [id number]  HPI 55 year old White Female for health maintenance exam and evaluation of medical issues. Has GYN physician who does GYN care. She brings in lab work done at [employer] where she is employed. She has a history of vitamin D deficiency, dependent edema, hypertriglyceridemia, anxiety, GE reflux, vertigo. Per graph says this past years been very difficult. One child is living at home working and attending college part-time. Another child is a Museum/gallery exhibitions officer at [employer]. Work continues to be stressful. She's not been able to diet exercise or lose weight. Remote history of back deficiency anemia.  Weight was 208 pounds in 2014 January 2016.  She is allergic to penicillin and is had possible adverse reactions to Crestor and Celexa.  Tetanus immunization 2008, colonoscopy 2006. Cholecystectomy 2007. Umbilical hernia repair lipoma removal around 2000. Pilonidal cyst excised 1994. Had pneumonia around the year 2000.  Social history: Married. Has 2 daughters. Husband is a Therapist, sports carrier. 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 Does not smoke. Social alcohol consumption.  History of carbuncle treated by OB/GYN.  Family history: Father with history of diabetes, heart disease, hyperlipidemia, MI of hypertension. Paternal uncle with MI. 2 brothers with hypertension, diabetes mellitus, hyperlipidemia. No sisters.  She is on Scientist, clinical (histocompatibility and immunogenetics). Takes omeprazole for GE reflux. Takes Lasix for dependent edema. Takes Xanax for anxiety. Has been tried on phentermine in the past for short periods of time. Had carbuncle January 2016 treated with doxycycline by mouth and Bactroban in nostrils. At that time was given Drisdol 50,000 units weekly for 12 weeks.   Review of Systems remarkable for situational stress this past year, some fatigue, inability to lose weight       Objective:   Physical Exam  Constitutional: She is oriented to person, place, and time. She appears well-developed and well-nourished. No distress.  HENT:  Head: Normocephalic and atraumatic.  Right Ear: External ear normal.  Left Ear: External ear normal.  Mouth/Throat: Oropharynx is clear and moist. No oropharyngeal exudate.  Eyes: Conjunctivae and EOM are normal. Pupils are equal, round, and reactive to light. Right eye exhibits no discharge. Left eye exhibits no discharge. No scleral icterus.  Neck: Neck supple. No JVD present. No thyromegaly present.  Cardiovascular: Normal rate, regular rhythm, normal heart sounds and intact distal pulses.   No murmur heard. Pulmonary/Chest: Effort normal and breath sounds normal. She has no wheezes. She has no rales.  Abdominal: Soft. Bowel sounds are normal. She exhibits no distension and no mass. There is no tenderness. There is no rebound and no guarding.  Musculoskeletal: She exhibits no edema.  Crepitus both knees  Lymphadenopathy:    She has no cervical adenopathy.  Neurological: She is alert and oriented to person, place, and time. She has normal reflexes. No cranial nerve deficit. Coordination normal.  Skin: Skin is warm and dry. No rash noted. She is not diaphoretic.  Psychiatric: She has a normal mood and affect. Her behavior is normal. Judgment and thought content normal.  Vitals reviewed.         Assessment & Plan:  Obesity  Vitamin D deficiency-recommend 50,000 units weekly for 12 weeks in 2000 units daily  Hyperlipidemia treated with Zetia and TriCor. Total cholesterol 216, triglycerides 285, HDL cholesterol 32, LDL cholesterol 127. May not been taking these medications regularly- recheck in 6 months  Anxiety-refill Xanax  Inability to lose weight-TSH 3.77 recheck in 6 months  Impaired glucose tolerance-hemoglobin A1c 5.7%  Plan: Return in 6 months for TSH, fasting lipid panel, hemoglobin A1c and vitamin D level. See  recommendations above. Encouraged diet exercise and weight loss.

## 2015-12-20 ENCOUNTER — Ambulatory Visit: Payer: Federal, State, Local not specified - PPO

## 2015-12-22 ENCOUNTER — Ambulatory Visit
Admission: RE | Admit: 2015-12-22 | Discharge: 2015-12-22 | Disposition: A | Discharge status: Home / Self Care | Payer: Federal, State, Local not specified - PPO | Source: Ambulatory Visit

## 2015-12-22 DIAGNOSIS — Z1231 Encounter for screening mammogram for malignant neoplasm of breast: Secondary | ICD-10-CM

## 2016-02-27 ENCOUNTER — Encounter: Payer: Self-pay | Admitting: Internal Medicine

## 2016-02-27 ENCOUNTER — Ambulatory Visit (INDEPENDENT_AMBULATORY_CARE_PROVIDER_SITE_OTHER): Payer: Federal, State, Local not specified - PPO | Admitting: Internal Medicine

## 2016-02-27 ENCOUNTER — Telehealth: Payer: Self-pay | Admitting: Internal Medicine

## 2016-02-27 VITALS — BP 148/90 | HR 92 | Temp 97.9°F | Resp 18 | Wt 218.0 lb

## 2016-02-27 DIAGNOSIS — R609 Edema, unspecified: Secondary | ICD-10-CM | POA: Diagnosis not present

## 2016-02-27 DIAGNOSIS — B351 Tinea unguium: Secondary | ICD-10-CM

## 2016-02-27 DIAGNOSIS — G43009 Migraine without aura, not intractable, without status migrainosus: Secondary | ICD-10-CM

## 2016-02-27 DIAGNOSIS — R030 Elevated blood-pressure reading, without diagnosis of hypertension: Secondary | ICD-10-CM | POA: Diagnosis not present

## 2016-02-27 DIAGNOSIS — IMO0001 Reserved for inherently not codable concepts without codable children: Secondary | ICD-10-CM

## 2016-02-27 MED ORDER — TERBINAFINE HCL 250 MG PO TABS: 250.0000 mg | ORAL_TABLET | Freq: Every day | ORAL | Status: DC

## 2016-02-27 MED ORDER — PREDNISONE 10 MG PO TABS: ORAL_TABLET | ORAL | Status: DC

## 2016-02-27 NOTE — Patient Instructions (Addendum)
Call with BP check later in the week. Lamisil for toe nail fungus x 2 months then call with progress report. 5 day tapering course of prednisone. Continue Lasix daily as prescribed.

## 2016-02-27 NOTE — Telephone Encounter (Signed)
Patient states that the pollen has about done her in this season.  She's having a lot of congestion and sneezing.  She has been taking additional sudafed and decongestants.  Yesterday, she didn't feel well.  She had extreme headache.  She didn't know if it was her BP or sinuses.  She didn't take her BP and got busy and forgot.  She had an unusual feeling last night almost like a little bit of tingling (even in her lips) that lasted most of the evening.  When she finally got home, she got in the recliner and just tried to relax for the remainder of the evening.  She rarely takes Xanax.  She took one last night.  States she slept VERY well, but this morning, her BP is up.    BP this morning was 148/100 (about an hour before taking any medication - although she only takes a water pill?).  Was on her feet a lot this weekend as well.  Feet are swollen as well.    She isn't sure if BP is up due to meds she's been taking for allergies?  Or, if she's having a BP issue.  Do you want to see her?  Or, other recommendations?  Please advise.    Best # to reach [phone], extension [phone].

## 2016-02-27 NOTE — Telephone Encounter (Signed)
Needs OV.  

## 2016-02-28 NOTE — Telephone Encounter (Signed)
Patient given appointment for 4/18 @ 4pm and seen.

## 2016-03-04 ENCOUNTER — Telehealth: Payer: Self-pay

## 2016-03-04 NOTE — Telephone Encounter (Signed)
Stable for now

## 2016-03-04 NOTE — Telephone Encounter (Signed)
Pt contacted office to give [place] some BP readings: Thurs 4/20- 140/84- taking prednisone Today 4/24- 134/84 mid-morning

## 2016-03-05 ENCOUNTER — Ambulatory Visit (AMBULATORY_SURGERY_CENTER): Payer: Self-pay

## 2016-03-05 VITALS — Ht 66.5 in | Wt 218.0 lb

## 2016-03-05 DIAGNOSIS — Z1211 Encounter for screening for malignant neoplasm of colon: Secondary | ICD-10-CM

## 2016-03-05 NOTE — Progress Notes (Signed)
Per pt, no allergies to soy or egg products.Pt not taking any weight loss meds or using  O2 at home. 

## 2016-03-09 NOTE — Progress Notes (Signed)
   Subjective:    Patient ID: Nancy Lawrence, female    DOB: 1961/05/19, 55 y.o.   MRN: [id number]  HPI In today with several complaints. She thinks she has allergy symptoms because of protracted headache. Blood pressure is elevated I think due to pain. Has noticed leg swelling.  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 Noticed swelling after that. Concerned about abnormal toenail.    Review of Systems see above     Objective:   Physical Exam  TMs are slightly full bilaterally. Pharynx is clear. Neck is supple. Chest clear. She sounds nasally congested. Cardiac exam regular rate and rhythm normal S1 and S2. Trace lower extremity edema. Brief neurological examShows no focal deficits. Has toenail fungus great toe      Assessment & Plan:    Headache--Likely migraine headache. Sterapred DS 10 mg 6 day dosepak. No focal deficits on neurological exam  Elevated blood pressure-likely due to pain. Call us with blood pressure check results in one week  Dependent edema-already on Lasix 40 mg daily. Continue as prescribed. He feet elevated. I think edema will resolve.  Onychomycosis-Lamisil prescribed for 30 days with one refill. If stays on that, she will need liver functions and CBC in 2 months  Plan: Sterapred DS 10 mg 6 day dosepak. I think dependent edema will resolve in the next couple of days. Keep feet elevated as much as possible. Call with blood pressure check in one week. Lamisil 250 mg daily for toenail fungus #30 with 1 refill. Will need CBC and liver functions in 2 months if she continues on it.  Addendum: Patient called one week later to report blood pressure had been taken and result was normal.

## 2016-03-13 ENCOUNTER — Encounter: Payer: Self-pay | Admitting: Internal Medicine

## 2016-03-13 ENCOUNTER — Ambulatory Visit (AMBULATORY_SURGERY_CENTER): Payer: Federal, State, Local not specified - PPO | Admitting: Internal Medicine

## 2016-03-13 VITALS — BP 132/71 | HR 60 | Temp 98.4°F | Resp 20 | Ht 66.5 in | Wt 218.0 lb

## 2016-03-13 DIAGNOSIS — K635 Polyp of colon: Secondary | ICD-10-CM

## 2016-03-13 DIAGNOSIS — D125 Benign neoplasm of sigmoid colon: Secondary | ICD-10-CM

## 2016-03-13 DIAGNOSIS — D122 Benign neoplasm of ascending colon: Secondary | ICD-10-CM | POA: Diagnosis not present

## 2016-03-13 DIAGNOSIS — D120 Benign neoplasm of cecum: Secondary | ICD-10-CM | POA: Diagnosis not present

## 2016-03-13 DIAGNOSIS — Z86010 Personal history of colonic polyps: Secondary | ICD-10-CM

## 2016-03-13 DIAGNOSIS — Z1211 Encounter for screening for malignant neoplasm of colon: Secondary | ICD-10-CM | POA: Diagnosis not present

## 2016-03-13 DIAGNOSIS — Z860101 Personal history of adenomatous and serrated colon polyps: Secondary | ICD-10-CM

## 2016-03-13 HISTORY — DX: Personal history of colonic polyps: Z86.010

## 2016-03-13 HISTORY — DX: Personal history of adenomatous and serrated colon polyps: Z86.0101

## 2016-03-13 MED ORDER — SODIUM CHLORIDE 0.9 % IV SOLN: 500.0000 mL | INTRAVENOUS | Status: DC

## 2016-03-13 NOTE — Progress Notes (Signed)
Called to room to assist during endoscopic procedure.  Patient ID and intended procedure confirmed with present staff. Received instructions for my participation in the procedure from the performing physician.  

## 2016-03-13 NOTE — Patient Instructions (Addendum)
I found and removed 4 small polyps that look benign. I will let you know pathology results and when to have another routine colonoscopy by mail.  I appreciate the opportunity to care for you. Gatha Mayer, MD, FACG   YOU HAD AN ENDOSCOPIC PROCEDURE TODAY AT [facility]:   Refer to the procedure report that was given to you for any specific questions about what was found during the examination.  If the procedure report does not answer your questions, please call your gastroenterologist to clarify.  If you requested that your care partner not be given the details of your procedure findings, then the procedure report has been included in a sealed envelope for you to review at your convenience later.  YOU SHOULD EXPECT: Some feelings of bloating in the abdomen. Passage of more gas than usual.  Walking can help get rid of the air that was put into your GI tract during the procedure and reduce the bloating. If you had a lower endoscopy (such as a colonoscopy or flexible sigmoidoscopy) you may notice spotting of blood in your stool or on the toilet paper. If you underwent a bowel prep for your procedure, you may not have a normal bowel movement for a few days.  Please Note:  You might notice some irritation and congestion in your nose or some drainage.  This is from the oxygen used during your procedure.  There is no need for concern and it should clear up in a day or so.  SYMPTOMS TO REPORT IMMEDIATELY:   Following lower endoscopy (colonoscopy or flexible sigmoidoscopy):  Excessive amounts of blood in the stool  Significant tenderness or worsening of abdominal pains  Swelling of the abdomen that is new, acute  Fever of 100F or higher    For urgent or emergent issues, a gastroenterologist can be reached at any hour by calling [phone].   DIET: Your first meal following the procedure should be a small meal and then it is ok to progress to your normal diet. Heavy  or fried foods are harder to digest and may make you feel nauseous or bloated.  Likewise, meals heavy in dairy and vegetables can increase bloating.  Drink plenty of fluids but you should avoid alcoholic beverages for 24 hours.  ACTIVITY:  You should plan to take it easy for the rest of today and you should NOT DRIVE or use heavy machinery until tomorrow (because of the sedation medicines used during the test).    FOLLOW UP: Our staff will call the number listed on your records the next business day following your procedure to check on you and address any questions or concerns that you may have regarding the information given to you following your procedure. If we do not reach you, we will leave a message.  However, if you are feeling well and you are not experiencing any problems, there is no need to return our call.  We will assume that you have returned to your regular daily activities without incident.  If any biopsies were taken you will be contacted by phone or by letter within the next 1-3 weeks.  Please call us at [phone] if you have not heard about the biopsies in 3 weeks.    SIGNATURES/CONFIDENTIALITY: You and/or your care partner have signed paperwork which will be entered into your electronic medical record.  These signatures attest to the fact that that the information above on your After Visit Summary has been reviewed  and is understood.  Full responsibility of the confidentiality of this discharge information lies with you and/or your care-partner.   Information on polyps and diverticulosis  given to you today  

## 2016-03-13 NOTE — Progress Notes (Signed)
A and Ox 3 Report to RN 

## 2016-03-13 NOTE — Op Note (Signed)
Person Patient Name: Nancy Lawrence Procedure Date: 03/13/2016 9:05 AM MRN: [id number] Endoscopist: Gatha Mayer , MD Age: 55 Date of Birth: 07/26/1961 Gender: Female Procedure:                Colonoscopy Indications:              Screening for colorectal malignant neoplasm, This                            is the patient's first screening colonoscopy Medicines:                Propofol per Anesthesia, Monitored Anesthesia Care Procedure:                Pre-Anesthesia Assessment:                           - Prior to the procedure, a History and Physical                            was performed, and patient medications and                            allergies were reviewed. The patient's tolerance of                            previous anesthesia was also reviewed. The risks                            and benefits of the procedure and the sedation                            options and risks were discussed with the patient.                            All questions were answered, and informed consent                            was obtained. Prior Anticoagulants: The patient has                            taken no previous anticoagulant or antiplatelet                            agents. ASA Grade Assessment: II - A patient with                            mild systemic disease. After reviewing the risks                            and benefits, the patient was deemed in                            satisfactory condition to undergo the procedure.  After obtaining informed consent, the colonoscope                            was passed under direct vision. Throughout the                            procedure, the patient's blood pressure, pulse, and                            oxygen saturations were monitored continuously. The                            Model CF-HQ190L [id number]) scope was introduced                            through the anus and  advanced to the the cecum,                            identified by appendiceal orifice and ileocecal                            valve. The colonoscopy was performed without                            difficulty. The patient tolerated the procedure                            well. The quality of the bowel preparation was                            excellent. The bowel preparation used was Miralax.                            The ileocecal valve, appendiceal orifice, and                            rectum were photographed. Scope In: 9:12:13 AM Scope Out: 9:28:21 AM Scope Withdrawal Time: 0 hours 12 minutes 50 seconds  Total Procedure Duration: 0 hours 16 minutes 8 seconds  Findings:                 The perianal and digital rectal examinations were                            normal.                           Three sessile polyps were found in the ascending                            colon and cecum. The polyps were 2 to 3 mm in size.                            These polyps were removed with a cold biopsy  forceps. Resection and retrieval were complete.                            Verification of patient identification for the                            specimen was done. Estimated blood loss was minimal.                           A 5 mm polyp was found in the sigmoid colon. The                            polyp was sessile. The polyp was removed with a                            cold snare. Resection and retrieval were complete.                            Verification of patient identification for the                            specimen was done. Estimated blood loss was minimal.                           A few diverticula were found in the sigmoid colon.                           The exam was otherwise without abnormality on                            direct and retroflexion views. Complications:            No immediate complications. Estimated Blood Loss:      Estimated blood loss was minimal. Impression:               - Three 2 to 3 mm polyps in the ascending colon and                            in the cecum, removed with a cold biopsy forceps.                            Resected and retrieved.                           - One 5 mm polyp in the sigmoid colon, removed with                            a cold snare. Resected and retrieved.                           - Diverticulosis in the sigmoid colon.                           - The examination  was otherwise normal on direct                            and retroflexion views. Recommendation:           - Patient has a contact number available for                            emergencies. The signs and symptoms of potential                            delayed complications were discussed with the                            patient. Return to normal activities tomorrow.                            Written discharge instructions were provided to the                            patient.                           - Resume previous diet.                           - Continue present medications.                           - Repeat colonoscopy is recommended. The                            colonoscopy date will be determined after pathology                            results from today's exam become available for                            review. Gatha Mayer, MD 03/13/2016 9:37:53 AM This report has been signed electronically. CC Letter to:             Cresenciano Lick. [place]

## 2016-03-14 ENCOUNTER — Telehealth: Payer: Self-pay

## 2016-03-14 NOTE — Telephone Encounter (Signed)
  Follow up Call-  Call back number 03/13/2016  Post procedure Call Back phone  # [phone]  Permission to leave phone message Yes     Patient questions:  Do you have a fever, pain , or abdominal swelling? No. Pain Score  0 *  Have you tolerated food without any problems? Yes.    Have you been able to return to your normal activities? Yes.    Do you have any questions about your discharge instructions: Diet   No. Medications  No. Follow up visit  No.  Do you have questions or concerns about your Care? No.  Actions: * If pain score is 4 or above: No action needed, pain <4.

## 2016-03-24 ENCOUNTER — Encounter: Payer: Self-pay | Admitting: Internal Medicine

## 2016-03-24 NOTE — Progress Notes (Signed)
Quick Note:  3 diminutive adenomas recall 2020 ______

## 2016-03-26 ENCOUNTER — Telehealth: Payer: Self-pay | Admitting: Internal Medicine

## 2016-03-26 DIAGNOSIS — S5001XA Contusion of right elbow, initial encounter: Secondary | ICD-10-CM | POA: Diagnosis not present

## 2016-03-26 NOTE — Telephone Encounter (Signed)
Pt called wondering if she could be seen by Dr. Renold Genta. Pt stated that her rt elbow was swollen, warm to the touch, dark red, bruised around the area and that her whole right arm was pained. She originally thought it was a bug bite, but after speaking with a neighbor who's also a nurse, she thinks it is bursitis. Pt's PCP was consulted and recommended that she go see an orthopedic doctor at [facility] if she doesn't have one already. Lmom making pt aware of MD's recommendations and to call back with any questions.

## 2016-04-01 ENCOUNTER — Telehealth: Payer: Self-pay | Admitting: Family Medicine

## 2016-04-01 NOTE — Telephone Encounter (Signed)
Patient called stating that she is having pain, swelling, warm to touch and redness in right elbow since Saturday.  Appointment made to see Dr. Wendi Snipes on 5/23 at 4:25

## 2016-04-02 ENCOUNTER — Ambulatory Visit (INDEPENDENT_AMBULATORY_CARE_PROVIDER_SITE_OTHER): Payer: Federal, State, Local not specified - PPO | Admitting: Family Medicine

## 2016-04-02 ENCOUNTER — Encounter: Payer: Self-pay | Admitting: Family Medicine

## 2016-04-02 ENCOUNTER — Encounter (INDEPENDENT_AMBULATORY_CARE_PROVIDER_SITE_OTHER): Payer: Self-pay

## 2016-04-02 VITALS — BP 126/73 | HR 77 | Temp 97.6°F | Ht 66.5 in | Wt 228.0 lb

## 2016-04-02 DIAGNOSIS — L989 Disorder of the skin and subcutaneous tissue, unspecified: Secondary | ICD-10-CM | POA: Diagnosis not present

## 2016-04-02 DIAGNOSIS — M25521 Pain in right elbow: Secondary | ICD-10-CM

## 2016-04-02 NOTE — Progress Notes (Signed)
   HPI  Patient presents today here for elbow pain and scalp lesion.  Elbow pain Evaluated by [facility] orthopedics already Started on 5/15 after each trip. No injury. Described as initially about 2 inches diameter area of redness of the point of the right elbow. She also had tenderness and fluctuance of the area. She describes a bursting type sensation in the development of a bruise over the next several hours. She had evaluation by orthopedics who felt that it was bursitis versus a bruise from an injury that she did not recognize. She would like my opinion.  Scalp lesion Several month history, noticed by the person who does her hair. She seen her dermatologist to recommended using ketoconazole shampoo and T-Gel, however she hasn't had any improvement, her dermatologist mentioned the possibility of psoriasis and she would like my opinion   PMH: Smoking status noted ROS: Per HPI  Objective: BP 126/73 mmHg  Pulse 77  Temp(Src) 97.6 F (36.4 C) (Oral)  Ht 5' 6.5" (1.689 m)  Wt 228 lb (103.42 kg)  BMI 36.25 kg/m2 Gen: NAD, alert, cooperative with exam HEENT: NCAT Ext: No edema, warm Neuro: Alert and oriented, No gross deficits  MSK Right elbow with no tenderness to palpation, resolving bruise of the proximal forearm and distal upper arm  Skin Approximately 2-1/2 inch in diameter roughly circular scaly gray silvery patch posterior occipital scalp with mild erythematous border, slightly raised  Assessment and plan:  # Elbow pain Agree with ortho,  bursitis versus injury Reassurance provided as she is resolving Continue ice and NSAIDs, discussed usual course of illness and expectations  # Scalp lesion Discussed most likely seborrhea, arises is a possibility. Continue ketoconazole shampoo, trial of T-Gel  Return to clinic as needed  Laroy Apple, MD [facility] 04/02/2016, 4:51 PM

## 2016-05-30 ENCOUNTER — Other Ambulatory Visit: Payer: Self-pay | Admitting: Internal Medicine

## 2016-06-05 ENCOUNTER — Telehealth: Payer: Self-pay | Admitting: Internal Medicine

## 2016-06-05 NOTE — Telephone Encounter (Signed)
Patient called today; states she needs to cancel her appointment for 8/1 (a 6 month follow up appointment).  She was supposed to bring her labs with her from her office.   She states they're having issues at work.  She'll have to cancel for now and call back in a couple of weeks and R/S.

## 2016-06-11 ENCOUNTER — Ambulatory Visit: Payer: Federal, State, Local not specified - PPO | Admitting: Internal Medicine

## 2016-08-21 DIAGNOSIS — L738 Other specified follicular disorders: Secondary | ICD-10-CM | POA: Diagnosis not present

## 2016-08-21 DIAGNOSIS — L400 Psoriasis vulgaris: Secondary | ICD-10-CM | POA: Diagnosis not present

## 2016-09-05 DIAGNOSIS — L400 Psoriasis vulgaris: Secondary | ICD-10-CM | POA: Diagnosis not present

## 2016-09-13 ENCOUNTER — Ambulatory Visit: Payer: Self-pay | Admitting: Internal Medicine

## 2016-09-13 ENCOUNTER — Telehealth: Payer: Self-pay | Admitting: Internal Medicine

## 2016-09-13 NOTE — Telephone Encounter (Signed)
Spoke with patient to advise that we would really like for her to have her fasting labs drawn prior to coming in for her 6 month follow up.  Her appointment was made for 2:00 today and just booked as "follow up" in the computer.  I called patient to find out why she was coming.  She was due to see [place] the end of July for 6 month follow up.  She had not been seen.  She apparently needed some medication refills, so she called for an appointment and was just booked as "follow up".  She usually gets her labs drawn at her office at [place].  She is due of Lipid (no Liver), TSH and A1C.    When I spoke with the patient this morning regarding her appointment for today at 2:00, she advised that she has not had labs drawn.  Advised that she was due for 6 month in July and we really needed her to have these fasting labs done prior to her visit; especially since she is 3 months past due on her appointment.  She states that she wanted to speak with Dr. Renold Genta about having some other labs done.  Advised that she can do that, however, we really need to get this appointment taken care of first.  She was wanting to avoid a second stick.  I advised that if she could call me at the time of her lab draw, that I would get her in for her appointment within a couple of days.  This would allow her time to meet with Dr. Renold Genta and still have time to call back and add to the blood work.    She's supposed to have the labs and call me back for the appointment.

## 2016-09-16 ENCOUNTER — Other Ambulatory Visit: Payer: Self-pay | Admitting: Internal Medicine

## 2016-11-22 ENCOUNTER — Other Ambulatory Visit: Payer: Self-pay | Admitting: Internal Medicine

## 2016-12-22 ENCOUNTER — Other Ambulatory Visit: Payer: Self-pay | Admitting: Internal Medicine

## 2016-12-23 NOTE — Telephone Encounter (Signed)
Patient not seen in over 6 months. She needs fasting lipid panel and basic metabolic panel plus office visit. She may tell you she wants to get the lab work done through her office which is a GYN office. This is okay with me but she needs to get these labs and be seen soon. We can only refill for 30 days until seen and she needs to understand that.

## 2017-01-02 NOTE — Telephone Encounter (Signed)
Pt needs to contact office

## 2017-01-13 DIAGNOSIS — Z130 Encounter for screening for diseases of the blood and blood-forming organs and certain disorders involving the immune mechanism: Secondary | ICD-10-CM | POA: Diagnosis not present

## 2017-01-13 DIAGNOSIS — Z1329 Encounter for screening for other suspected endocrine disorder: Secondary | ICD-10-CM | POA: Diagnosis not present

## 2017-01-13 DIAGNOSIS — Z0000 Encounter for general adult medical examination without abnormal findings: Secondary | ICD-10-CM | POA: Diagnosis not present

## 2017-01-13 DIAGNOSIS — Z13220 Encounter for screening for lipoid disorders: Secondary | ICD-10-CM | POA: Diagnosis not present

## 2017-01-13 DIAGNOSIS — Z131 Encounter for screening for diabetes mellitus: Secondary | ICD-10-CM | POA: Diagnosis not present

## 2017-01-13 DIAGNOSIS — E559 Vitamin D deficiency, unspecified: Secondary | ICD-10-CM | POA: Diagnosis not present

## 2017-01-16 DIAGNOSIS — H16043 Marginal corneal ulcer, bilateral: Secondary | ICD-10-CM | POA: Diagnosis not present

## 2017-01-17 ENCOUNTER — Encounter: Payer: Self-pay | Admitting: Internal Medicine

## 2017-01-17 ENCOUNTER — Other Ambulatory Visit (HOSPITAL_COMMUNITY)
Admission: RE | Admit: 2017-01-17 | Discharge: 2017-01-17 | Disposition: A | Discharge status: Home / Self Care | Payer: Federal, State, Local not specified - PPO | Source: Ambulatory Visit | Attending: Internal Medicine | Admitting: Internal Medicine

## 2017-01-17 ENCOUNTER — Ambulatory Visit (INDEPENDENT_AMBULATORY_CARE_PROVIDER_SITE_OTHER): Payer: Federal, State, Local not specified - PPO | Admitting: Internal Medicine

## 2017-01-17 VITALS — HR 77 | Temp 98.7°F | Ht 64.5 in | Wt 228.0 lb

## 2017-01-17 DIAGNOSIS — F411 Generalized anxiety disorder: Secondary | ICD-10-CM | POA: Diagnosis not present

## 2017-01-17 DIAGNOSIS — Z0000 Encounter for general adult medical examination without abnormal findings: Secondary | ICD-10-CM | POA: Diagnosis not present

## 2017-01-17 DIAGNOSIS — Z01419 Encounter for gynecological examination (general) (routine) without abnormal findings: Secondary | ICD-10-CM | POA: Diagnosis not present

## 2017-01-17 DIAGNOSIS — E559 Vitamin D deficiency, unspecified: Secondary | ICD-10-CM

## 2017-01-17 DIAGNOSIS — E784 Other hyperlipidemia: Secondary | ICD-10-CM | POA: Diagnosis not present

## 2017-01-17 DIAGNOSIS — H16043 Marginal corneal ulcer, bilateral: Secondary | ICD-10-CM | POA: Diagnosis not present

## 2017-01-17 DIAGNOSIS — Z6838 Body mass index (BMI) 38.0-38.9, adult: Secondary | ICD-10-CM

## 2017-01-17 DIAGNOSIS — Z86010 Personal history of colonic polyps: Secondary | ICD-10-CM

## 2017-01-17 DIAGNOSIS — Z124 Encounter for screening for malignant neoplasm of cervix: Secondary | ICD-10-CM

## 2017-01-17 DIAGNOSIS — R609 Edema, unspecified: Secondary | ICD-10-CM

## 2017-01-17 DIAGNOSIS — R319 Hematuria, unspecified: Secondary | ICD-10-CM

## 2017-01-17 DIAGNOSIS — K219 Gastro-esophageal reflux disease without esophagitis: Secondary | ICD-10-CM

## 2017-01-17 DIAGNOSIS — N920 Excessive and frequent menstruation with regular cycle: Secondary | ICD-10-CM | POA: Diagnosis not present

## 2017-01-17 DIAGNOSIS — E7849 Other hyperlipidemia: Secondary | ICD-10-CM

## 2017-01-17 DIAGNOSIS — N939 Abnormal uterine and vaginal bleeding, unspecified: Secondary | ICD-10-CM

## 2017-01-17 LAB — POCT URINALYSIS DIPSTICK
Bilirubin, UA: NEGATIVE
Glucose, UA: NEGATIVE
KETONES UA: NEGATIVE
Leukocytes, UA: NEGATIVE
Nitrite, UA: NEGATIVE
PH UA: 6
PROTEIN UA: NEGATIVE
Spec Grav, UA: 1.015
Urobilinogen, UA: NEGATIVE

## 2017-01-17 MED ORDER — OMEPRAZOLE 20 MG PO CPDR: 20.0000 mg | DELAYED_RELEASE_CAPSULE | Freq: Every day | ORAL | 3 refills | Status: DC

## 2017-01-17 MED ORDER — ALPRAZOLAM 0.5 MG PO TABS: 0.5000 mg | ORAL_TABLET | Freq: Two times a day (BID) | ORAL | 2 refills | Status: DC | PRN

## 2017-01-17 MED ORDER — VALACYCLOVIR HCL 1 G PO TABS: ORAL_TABLET | ORAL | 0 refills | Status: DC

## 2017-01-17 MED ORDER — EZETIMIBE 10 MG PO TABS: ORAL_TABLET | ORAL | 3 refills | Status: DC

## 2017-01-17 MED ORDER — FENOFIBRATE 145 MG PO TABS: 145.0000 mg | ORAL_TABLET | Freq: Every day | ORAL | 3 refills | Status: DC

## 2017-01-17 MED ORDER — LEVOTHYROXINE SODIUM 50 MCG PO TABS: 50.0000 ug | ORAL_TABLET | Freq: Every day | ORAL | 0 refills | Status: DC

## 2017-01-17 MED ORDER — FUROSEMIDE 40 MG PO TABS: 40.0000 mg | ORAL_TABLET | Freq: Every day | ORAL | 2 refills | Status: DC | PRN

## 2017-01-17 NOTE — Progress Notes (Signed)
Subjective:    Patient ID: FEDORA KNISELY, female    DOB: 1961-05-28, 56 y.o.   MRN: [id number]  HPI 56 year old White Female for health maintenance examAnd evaluation of medical issues. She has GYN physician at [place] who does gynecology care. She brings in lab work done at [employer] where she is employed.  She has a history of vitamin D deficiency, dependent edema, hypertriglyceridemia, anxiety, GE reflux, vertigo. Work continues to be stressful. She's not been able to diet exercise or lose weight.  Remote history of iron deficiency anemia. She is now menopausal.  She is allergic penicillin and had possible adverse reactions to Crestor and Celexa.  Umbilical hernia repair and lipoma removal around 2000. Pilonidal cyst excised 1994. Cholecystectomy 2007. Colonoscopy 2006 and 2017. Recall for colonoscopy 2020 due to adenomas. Had pneumonia around the year 2000.  Recent lab work at [place] showed hypertriglyceridemia. Said he of course is not effective against triglycerides. Statin intolerance previously noted. Hemoglobin A1c within normal limits. Thyroid functions normal. Low normal vitamin D level.  Had tetanus immunization 2008 and will check records at [place] and get immunization there.  Social history: Married. Has 2 daughters. Husband is a Therapist, sports carrier. Completed 2 years of college. Does not smoke. Social alcohol consumption. Works in Mudlogger at [employer].  She has an IUD in place and noticed a little bit of spotting recently. This was unusual.  Has had issues with her scalp recently. Area of redness. Has seen dermatologist to his tried various preparations.  Family history: Father with history of diabetes, heart disease, hyperlipidemia MI, hypertension. Paternal uncle with MI. 2 brothers with hypertension, diabetes mellitus, hyperlipidemia. No sisters.  She is on Scientist, clinical (histocompatibility and immunogenetics). Takes omeprazole for GE reflux. Takes Lasix for  dependent edema. Takes Xanax for anxiety. Has been tried on phentermine in the past for short periods of time.      Review of Systems  Constitutional: Positive for fatigue.  Cardiovascular: Negative.   Genitourinary:       Recent vaginal spotting  Skin:       Itching right scalp  Neurological: Negative.        Objective:   Physical Exam  Constitutional: She is oriented to person, place, and time. She appears well-developed and well-nourished. No distress.  HENT:  Head: Normocephalic and atraumatic.  Right Ear: External ear normal.  Left Ear: External ear normal.  Mouth/Throat: Oropharynx is clear and moist. No oropharyngeal exudate.  Eyes: Conjunctivae and EOM are normal. Pupils are equal, round, and reactive to light. Left eye exhibits no discharge. No scleral icterus.  Neck: Neck supple. No JVD present. No thyromegaly present.  Cardiovascular: Normal rate, regular rhythm, normal heart sounds and intact distal pulses.   No murmur heard. Pulmonary/Chest: No respiratory distress. She has no wheezes. She has no rales. She exhibits no tenderness.  Breasts normal female  Abdominal: Soft. Bowel sounds are normal. She exhibits no distension and no mass. There is no tenderness. There is no rebound and no guarding.  Genitourinary:  Genitourinary Comments: No active bleeding. Pap taken. Bimanual normal.  Musculoskeletal: She exhibits no edema.  Lymphadenopathy:    She has no cervical adenopathy.  Neurological: She is alert and oriented to person, place, and time. She has normal reflexes. No cranial nerve deficit. Coordination normal.  Skin: Skin is warm and dry. No rash noted. She is not diaphoretic.  Psychiatric: She has a normal mood and affect. Her behavior is normal.  Judgment and thought content normal.  Vitals reviewed.         Assessment & Plan:  Hyperlipidemia-treated with fenofibrate and Zetia  Obesity-continue diet exercise and weight loss efforts  ? Seborrhea of  scalp versus psoriasis  Vaginal spotting-patient to check with GYN physician. Patient is postmenopausal.  Low normal vitamin D level-take 2000 units vitamin D 3 daily  History of adenomatous colon polyps repeat colonoscopy suggested by gastroenterologist in 2020  History of dependent edema graft history of anxiety  GE reflux  Plan: Continue diet exercise and weight loss efforts and return in 6 months.

## 2017-01-18 LAB — URINALYSIS, MICROSCOPIC ONLY
BACTERIA UA: NONE SEEN [HPF]
CASTS: NONE SEEN [LPF]
Crystals: NONE SEEN [HPF]
RBC / HPF: NONE SEEN RBC/HPF (ref <=2)
Squamous Epithelial / LPF: NONE SEEN [HPF] (ref <=5)
WBC UA: NONE SEEN WBC/HPF (ref <=5)
Yeast: NONE SEEN [HPF]

## 2017-01-21 ENCOUNTER — Telehealth: Payer: Self-pay

## 2017-01-21 ENCOUNTER — Other Ambulatory Visit: Payer: Self-pay | Admitting: Internal Medicine

## 2017-01-21 DIAGNOSIS — N6012 Diffuse cystic mastopathy of left breast: Secondary | ICD-10-CM

## 2017-01-21 DIAGNOSIS — N63 Unspecified lump in unspecified breast: Secondary | ICD-10-CM

## 2017-01-21 LAB — CYTOLOGY - PAP
Adequacy: ABSENT
Diagnosis: NEGATIVE

## 2017-01-21 NOTE — Telephone Encounter (Signed)
received a phone call from [facility] hospital pt had called to scheduled a screening mammo during that call pt reported a lump on her left breast, ordere needed to be changed from screening to diagnostic mammo and breast US (left) due to this new breast issue. Done ok to change per Dr. Renold Genta.

## 2017-01-29 ENCOUNTER — Other Ambulatory Visit: Payer: Federal, State, Local not specified - PPO

## 2017-01-30 ENCOUNTER — Ambulatory Visit
Admission: RE | Admit: 2017-01-30 | Discharge: 2017-01-30 | Disposition: A | Discharge status: Home / Self Care | Payer: Federal, State, Local not specified - PPO | Source: Ambulatory Visit | Attending: Internal Medicine | Admitting: Internal Medicine

## 2017-01-30 ENCOUNTER — Other Ambulatory Visit: Payer: Self-pay | Admitting: Internal Medicine

## 2017-01-30 DIAGNOSIS — N6012 Diffuse cystic mastopathy of left breast: Secondary | ICD-10-CM

## 2017-01-30 DIAGNOSIS — N6489 Other specified disorders of breast: Secondary | ICD-10-CM | POA: Diagnosis not present

## 2017-01-30 DIAGNOSIS — N63 Unspecified lump in unspecified breast: Secondary | ICD-10-CM

## 2017-01-30 DIAGNOSIS — R922 Inconclusive mammogram: Secondary | ICD-10-CM | POA: Diagnosis not present

## 2017-02-06 DIAGNOSIS — M5030 Other cervical disc degeneration, unspecified cervical region: Secondary | ICD-10-CM | POA: Diagnosis not present

## 2017-02-07 NOTE — Patient Instructions (Signed)
It was a pleasure to see you today. Continue to work on diet exercise and weight loss efforts. Take vitamin D supplement. Continue medications for hyperlipidemia. May need to return to dermatologist regarding scalp itching. Please have GYN physician see you about vaginal spotting. Return in 6 months.

## 2017-02-20 DIAGNOSIS — M79601 Pain in right arm: Secondary | ICD-10-CM | POA: Diagnosis not present

## 2017-02-20 DIAGNOSIS — M25511 Pain in right shoulder: Secondary | ICD-10-CM | POA: Diagnosis not present

## 2017-02-24 DIAGNOSIS — M79601 Pain in right arm: Secondary | ICD-10-CM | POA: Diagnosis not present

## 2017-02-24 DIAGNOSIS — M25511 Pain in right shoulder: Secondary | ICD-10-CM | POA: Diagnosis not present

## 2017-02-26 DIAGNOSIS — M79601 Pain in right arm: Secondary | ICD-10-CM | POA: Diagnosis not present

## 2017-02-26 DIAGNOSIS — M25511 Pain in right shoulder: Secondary | ICD-10-CM | POA: Diagnosis not present

## 2017-03-03 DIAGNOSIS — M79601 Pain in right arm: Secondary | ICD-10-CM | POA: Diagnosis not present

## 2017-03-03 DIAGNOSIS — M25511 Pain in right shoulder: Secondary | ICD-10-CM | POA: Diagnosis not present

## 2017-03-05 DIAGNOSIS — M5030 Other cervical disc degeneration, unspecified cervical region: Secondary | ICD-10-CM | POA: Diagnosis not present

## 2017-04-16 ENCOUNTER — Other Ambulatory Visit: Payer: Self-pay | Admitting: Internal Medicine

## 2017-07-19 ENCOUNTER — Other Ambulatory Visit: Payer: Self-pay | Admitting: Internal Medicine

## 2017-08-18 DIAGNOSIS — E039 Hypothyroidism, unspecified: Secondary | ICD-10-CM | POA: Diagnosis not present

## 2017-08-19 ENCOUNTER — Ambulatory Visit (INDEPENDENT_AMBULATORY_CARE_PROVIDER_SITE_OTHER): Payer: Federal, State, Local not specified - PPO | Admitting: Internal Medicine

## 2017-08-19 ENCOUNTER — Encounter: Payer: Self-pay | Admitting: Internal Medicine

## 2017-08-19 VITALS — BP 110/68 | Temp 98.6°F | Wt 220.0 lb

## 2017-08-19 DIAGNOSIS — L210 Seborrhea capitis: Secondary | ICD-10-CM

## 2017-08-19 DIAGNOSIS — E7849 Other hyperlipidemia: Secondary | ICD-10-CM

## 2017-08-19 DIAGNOSIS — Z86010 Personal history of colonic polyps: Secondary | ICD-10-CM | POA: Diagnosis not present

## 2017-08-19 DIAGNOSIS — R609 Edema, unspecified: Secondary | ICD-10-CM

## 2017-08-19 DIAGNOSIS — F411 Generalized anxiety disorder: Secondary | ICD-10-CM | POA: Diagnosis not present

## 2017-08-19 DIAGNOSIS — K219 Gastro-esophageal reflux disease without esophagitis: Secondary | ICD-10-CM | POA: Diagnosis not present

## 2017-08-19 DIAGNOSIS — Z6837 Body mass index (BMI) 37.0-37.9, adult: Secondary | ICD-10-CM | POA: Diagnosis not present

## 2017-08-19 DIAGNOSIS — R7302 Impaired glucose tolerance (oral): Secondary | ICD-10-CM

## 2017-08-19 MED ORDER — PROGESTERONE MICRONIZED 100 MG PO CAPS: 100.0000 mg | ORAL_CAPSULE | Freq: Every day | ORAL | 1 refills | Status: DC

## 2017-08-19 MED ORDER — FENOFIBRATE 145 MG PO TABS: 145.0000 mg | ORAL_TABLET | Freq: Every day | ORAL | 1 refills | Status: DC

## 2017-08-19 MED ORDER — EZETIMIBE 10 MG PO TABS: ORAL_TABLET | ORAL | 3 refills | Status: DC

## 2017-08-19 MED ORDER — DOXYCYCLINE HYCLATE 100 MG PO TABS: 100.0000 mg | ORAL_TABLET | Freq: Two times a day (BID) | ORAL | 0 refills | Status: DC

## 2017-08-19 MED ORDER — LEVOTHYROXINE SODIUM 50 MCG PO TABS: 50.0000 ug | ORAL_TABLET | Freq: Every day | ORAL | 0 refills | Status: DC

## 2017-08-19 MED ORDER — MUPIROCIN 2 % EX OINT: 1.0000 "application " | TOPICAL_OINTMENT | Freq: Two times a day (BID) | CUTANEOUS | 1 refills | Status: DC

## 2017-08-19 MED ORDER — VALACYCLOVIR HCL 1 G PO TABS: ORAL_TABLET | ORAL | 0 refills | Status: DC

## 2017-08-19 MED ORDER — ALPRAZOLAM 0.5 MG PO TABS: 0.5000 mg | ORAL_TABLET | Freq: Two times a day (BID) | ORAL | 1 refills | Status: DC | PRN

## 2017-08-19 MED ORDER — FUROSEMIDE 40 MG PO TABS: 40.0000 mg | ORAL_TABLET | Freq: Every day | ORAL | 2 refills | Status: DC | PRN

## 2017-08-19 NOTE — Progress Notes (Signed)
   Subjective:    Patient ID: Nancy Lawrence, female    DOB: 1960-11-22, 55 y.o.   MRN: [id number]  HPI She brings in results from [facility] drawn at [facility] today.  TSH is normal at 1.75.  She has gained 8 pounds since last visit March 2018.  Not motivated to exercise.  History of vitamin D deficiency, dependent edema, hypertriglyceridemia, anxiety, GE reflux, vertigo.  She is now menopausal.  For some time, has had seborrhea of scalp versus psoriasis.  Saw Dr. Ubaldo Glassing and was given several different treatments that did not improved.  She wants my opinion.  I do not think that I will want to prescribe anything she should return to dermatologist.  Is been going on for some time.  Had normal hemoglobin A1c in the Spring.  In the Spring had a low normal vitamin D level and hypertriglyceridemia.  She is on Scientist, clinical (histocompatibility and immunogenetics).  She takes omeprazole for GE reflux and Lasix for dependent edema.  Take Xanax for anxiety.    Review of Systems see above     Objective:   Physical Exam No thyromegaly.  Neck is supple without JVD thyromegaly or carotid bruits.  No adenopathy.  Chest clear.  Cardiac exam regular rate and rhythm normal S1 and.  Extremities without edema.       Assessment & Plan:  BMI 37-needs to lose weight.  Needs to be more active after work.  Needs to take dieting seriously  Hypertriglyceridemia he is on [employer] and Zetia.  Seborrhea of scalp-asked her to see a dermatologist once again regarding treatment  Anxiety refill Xanax  Dependent edema refill Lasix 40 mg daily  She had cervical epidural steroid injection for neck pain and cervical cephalgia April 2018 C7-T1 by Dr. Nelva Bush  Had colonoscopy May 2017 with tubular adenomas being removed by Dr. Carlean Purl.  Had mammogram 2018 with evaluation of?  Left breast mass.  There was an 8 mm oval circumscribed mass in the right breast and in the left breast there was a 5 mm oval circumscribed mass.  Ultrasound was performed.   These were thought to be fibrocystic changes.  Follow-up is recommended in 1 year  Had Pap smear in March through this office.  Physical exam due March 2019.

## 2017-09-10 NOTE — Patient Instructions (Signed)
Please take diet exercise and weight loss seriously.  Continue same medications.  Physical exam due in March.  Will need fasting lab work prior to physical exam.  Have annual flu vaccine through her employment.  Xanax refilled.

## 2017-10-15 DIAGNOSIS — L821 Other seborrheic keratosis: Secondary | ICD-10-CM | POA: Diagnosis not present

## 2017-10-15 DIAGNOSIS — L400 Psoriasis vulgaris: Secondary | ICD-10-CM | POA: Diagnosis not present

## 2017-12-09 ENCOUNTER — Other Ambulatory Visit: Payer: Self-pay | Admitting: Internal Medicine

## 2017-12-09 NOTE — Telephone Encounter (Signed)
PE due after March 9th please call her before refilling

## 2017-12-10 NOTE — Telephone Encounter (Signed)
Left message to schedule CPE and then we can send rx to her pharmacy.

## 2018-01-26 ENCOUNTER — Other Ambulatory Visit: Payer: Self-pay

## 2018-01-26 MED ORDER — LEVOTHYROXINE SODIUM 50 MCG PO TABS: 50.0000 ug | ORAL_TABLET | Freq: Every day | ORAL | 0 refills | Status: DC

## 2018-01-28 ENCOUNTER — Other Ambulatory Visit: Payer: Self-pay

## 2018-01-28 MED ORDER — OMEPRAZOLE 20 MG PO CPDR: 20.0000 mg | DELAYED_RELEASE_CAPSULE | Freq: Every day | ORAL | 3 refills | Status: DC

## 2018-02-16 ENCOUNTER — Other Ambulatory Visit: Payer: Self-pay | Admitting: Internal Medicine

## 2018-03-16 DIAGNOSIS — Z130 Encounter for screening for diseases of the blood and blood-forming organs and certain disorders involving the immune mechanism: Secondary | ICD-10-CM | POA: Diagnosis not present

## 2018-03-16 DIAGNOSIS — E559 Vitamin D deficiency, unspecified: Secondary | ICD-10-CM | POA: Diagnosis not present

## 2018-03-16 DIAGNOSIS — Z0000 Encounter for general adult medical examination without abnormal findings: Secondary | ICD-10-CM | POA: Diagnosis not present

## 2018-03-16 DIAGNOSIS — Z1329 Encounter for screening for other suspected endocrine disorder: Secondary | ICD-10-CM | POA: Diagnosis not present

## 2018-03-16 DIAGNOSIS — Z13220 Encounter for screening for lipoid disorders: Secondary | ICD-10-CM | POA: Diagnosis not present

## 2018-03-19 ENCOUNTER — Encounter: Payer: Self-pay | Admitting: Internal Medicine

## 2018-03-19 ENCOUNTER — Ambulatory Visit (INDEPENDENT_AMBULATORY_CARE_PROVIDER_SITE_OTHER): Payer: Federal, State, Local not specified - PPO | Admitting: Internal Medicine

## 2018-03-19 VITALS — BP 108/80 | HR 96 | Ht 65.0 in | Wt 223.0 lb

## 2018-03-19 DIAGNOSIS — N6012 Diffuse cystic mastopathy of left breast: Secondary | ICD-10-CM

## 2018-03-19 DIAGNOSIS — F411 Generalized anxiety disorder: Secondary | ICD-10-CM

## 2018-03-19 DIAGNOSIS — Z86010 Personal history of colonic polyps: Secondary | ICD-10-CM | POA: Diagnosis not present

## 2018-03-19 DIAGNOSIS — R609 Edema, unspecified: Secondary | ICD-10-CM

## 2018-03-19 DIAGNOSIS — Z789 Other specified health status: Secondary | ICD-10-CM

## 2018-03-19 DIAGNOSIS — Z0000 Encounter for general adult medical examination without abnormal findings: Secondary | ICD-10-CM | POA: Diagnosis not present

## 2018-03-19 DIAGNOSIS — L219 Seborrheic dermatitis, unspecified: Secondary | ICD-10-CM | POA: Diagnosis not present

## 2018-03-19 DIAGNOSIS — K219 Gastro-esophageal reflux disease without esophagitis: Secondary | ICD-10-CM | POA: Diagnosis not present

## 2018-03-19 DIAGNOSIS — Z8241 Family history of sudden cardiac death: Secondary | ICD-10-CM

## 2018-03-19 DIAGNOSIS — Z6837 Body mass index (BMI) 37.0-37.9, adult: Secondary | ICD-10-CM | POA: Diagnosis not present

## 2018-03-19 LAB — POCT URINALYSIS DIPSTICK
Appearance: NORMAL
BILIRUBIN UA: NEGATIVE
Blood, UA: NEGATIVE
Glucose, UA: NEGATIVE
Ketones, UA: NEGATIVE
Leukocytes, UA: NEGATIVE
Nitrite, UA: NEGATIVE
ODOR: NORMAL
PH UA: 6 (ref 5.0–8.0)
Protein, UA: NEGATIVE
Spec Grav, UA: 1.015 (ref 1.010–1.025)
UROBILINOGEN UA: 0.2 U/dL

## 2018-03-19 NOTE — Progress Notes (Signed)
Subjective:    Patient ID: Nancy Lawrence, female    DOB: 1961/06/01, 57 y.o.   MRN: [id number]  HPI 57 year old Female for health maintenance exam and evaluation of medical issues.  Her brother passed away 02/20/2018 of a massive MI at age 69.  He brings in lab work from [place] where she is employed.  She had a normal T4 T3 and CBC.  TSH was normal in October 2018.  She had normal Pap smear follow-up 02-20-17.  Weight continues to be an issue for her.  Have suggested Dr. Migdalia Dk practice as a possibility.  She is now menopausal and has issues with hot flashes.  She is allergic to Penicillin and has had possible adverse reactions to Crestor and Celexa.  He has a scalp dermatitis treated by Dr. Ubaldo Glassing.  History of vitamin D deficiency, dependent edema, hyperlipidemia, anxiety, GE reflux, vertigo.  Umbilical hernia repair lipoma removal around 02/21/1999.  Pilonidal cyst excised 1994.  Cholecystectomy February 20, 2006.  Colonoscopy 02-20-2005 and 02/21/16.  Recall for colonoscopy Feb 21, 2019 DVT due to adenomas.  Had pneumonia around the year 1999-02-21.  History of statin intolerance.  History of hypertriglyceridemia.  Hemoglobin A1c checked 02/20/2017 was within normal limits.  Had tetanus immunization 2007/02/21.  She was told to check [facility] records and get immunization they are when she was seen here 02-20-17.  History of IUD in place.  Social history: Married.  2 daughters.  Husband is a Therapist, sports carrier.  Completed 2 years of college.  Does not smoke.  Social alcohol consumption.  Works in [employer] at [employer].  Family history: Brother died recently of an acute MI.  Father with history of diabetes, heart disease, hyperlipidemia, MI hypertension.  Paternal uncle with MI.  2 brothers with hypertension, diabetes mellitus, hyperlipidemia.  No sisters.  She has been on Zetia and TriCor for hypertriglyceridemia.  Has taken omeprazole for GE reflux and Lasix for dependent edema.  Takes Xanax for anxiety.  Has  been tried on phentermine for weight loss in the past for short periods of time.  Referral to cardiology because of family history of heart disease.  She is on low-dose thyroid replacement.      Review of Systems see above     Objective:   Physical Exam  Constitutional: She is oriented to person, place, and time. She appears well-developed and well-nourished. No distress.  HENT:  Head: Normocephalic and atraumatic.  Right Ear: External ear normal.  Left Ear: External ear normal.  Mouth/Throat: Oropharynx is clear and moist. No oropharyngeal exudate.  Eyes: Pupils are equal, round, and reactive to light. EOM are normal. Right eye exhibits no discharge. Left eye exhibits no discharge. No scleral icterus.  Neck: Neck supple. No JVD present. No tracheal deviation present. No thyromegaly present.  Cardiovascular: Normal rate, regular rhythm, normal heart sounds and intact distal pulses. Exam reveals no gallop and no friction rub.  No murmur heard. Pulmonary/Chest: Effort normal and breath sounds normal. No stridor. No respiratory distress. She has no wheezes. She has no rales. She exhibits no tenderness.  Breasts normal female  Abdominal: Soft. Bowel sounds are normal. She exhibits no distension and no mass. There is no tenderness. There is no rebound and no guarding. No hernia.  Musculoskeletal: She exhibits no edema.  Lymphadenopathy:    She has no cervical adenopathy.  Neurological: She is alert and oriented to person, place, and time. She displays normal reflexes. No cranial nerve deficit or sensory  deficit. She exhibits normal muscle tone. Coordination normal.  Skin: Skin is warm and dry. She is not diaphoretic.  Scalp dermatitis noted  Psychiatric: She has a normal mood and affect. Her behavior is normal. Judgment and thought content normal.  Vitals reviewed.         Assessment & Plan:  Hyperlipidemia-statin intolerant has been on TriCor and Zetia  Scalp  dermatitis-treated by Dr. Ubaldo Glassing at [facility] dermatology  Anxiety-treated sparingly with Xanax  Family history of coronary artery disease-refer to cardiology for evaluation  BMI 37.11 discussed various routes for weight loss including Dr. Migdalia Dk clinic.  Needs to exercise.  Wants cardiology evaluation.  TSH is been in the 3 range in the past and she is on low-dose thyroid replacement.  History of sleep apnea  History of benign positional vertigo  Health maintenance-mammogram scheduled for July 2019  Dependent edema-treated with Lasix  History of GE reflux treated with PPI

## 2018-04-23 ENCOUNTER — Other Ambulatory Visit: Payer: Self-pay | Admitting: Internal Medicine

## 2018-05-06 ENCOUNTER — Other Ambulatory Visit: Payer: Self-pay | Admitting: Internal Medicine

## 2018-05-06 DIAGNOSIS — Z1231 Encounter for screening mammogram for malignant neoplasm of breast: Secondary | ICD-10-CM

## 2018-05-09 NOTE — Patient Instructions (Signed)
Patient had cardiology evaluation status post sudden death of her brother and family history of coronary disease.  I would like for her to take diet exercise and weight loss seriously.  We have had many discussions about this.  She is on Zetia and TriCor as she is statin intolerant.  She takes Lasix for dependent edema.

## 2018-05-11 ENCOUNTER — Encounter: Payer: Self-pay | Admitting: Cardiology

## 2018-05-12 ENCOUNTER — Encounter: Payer: Self-pay | Admitting: Internal Medicine

## 2018-05-12 DIAGNOSIS — Z131 Encounter for screening for diabetes mellitus: Secondary | ICD-10-CM | POA: Diagnosis not present

## 2018-05-12 NOTE — Progress Notes (Signed)
Nancy Lick, MD Reason for referral-family history of heart disease and hyperlipidemia  HPI: 57 year old female for evaluation of family history of heart disease and hyperlipidemia at request of Tedra Senegal, MD.  Patient seen previously but not since 2009.  Nuclear study December 2008 showed no ischemia and ejection fraction 66%.  Monitor at that time showed sinus rhythm.  History of intolerance to statins.  Most recent lipids from May 2019 showed total cholesterol 188, HDL 37, triglycerides 223 and LDL 117.  Patient has a strong family history of coronary disease.  She recently lost her brother at age 70 from a myocardial infarction.  She denies dyspnea on exertion, orthopnea, PND, pedal edema, exertional chest pain or syncope.  Occasional brief flutter but no sustained palpitations.  Because of the above we were asked to evaluate.  Current Outpatient Medications  Medication Sig Dispense Refill  . ALPRAZolam (XANAX) 0.5 MG tablet Take 1 tablet (0.5 mg total) by mouth 2 (two) times daily as needed for sleep. 60 tablet 1  . cetirizine (ZYRTEC) 10 MG tablet Take 10 mg by mouth daily.      Marland Kitchen ezetimibe (ZETIA) 10 MG tablet TAKE 1 TABLET(S) BY MOUTH DAILY 90 tablet 3  . fenofibrate (TRICOR) 145 MG tablet Take 1 tablet (145 mg total) by mouth daily. 90 tablet 1  . furosemide (LASIX) 40 MG tablet Take 1 tablet (40 mg total) by mouth daily as needed. 90 tablet 2  . ibuprofen (ADVIL,MOTRIN) 200 MG tablet Take 200 mg by mouth every 6 (six) hours as needed.      Marland Kitchen levocetirizine (XYZAL) 5 MG tablet Take 5 mg by mouth every evening.    Marland Kitchen levonorgestrel (MIRENA) 20 MCG/24HR IUD 1 each by Intrauterine route once.      Marland Kitchen levothyroxine (SYNTHROID, LEVOTHROID) 50 MCG tablet TAKE 1 TABLET BY MOUTH EVERY DAY 90 tablet 0  . omeprazole (PRILOSEC) 20 MG capsule Take 1 capsule (20 mg total) by mouth daily. 90 capsule 3  . progesterone (PROMETRIUM) 100 MG capsule TAKE 1 CAPSULE BY MOUTH EVERY DAY 90 capsule  1  . valACYclovir (VALTREX) 1000 MG tablet Take 2 tabs by mouth at onset of symptoms and repeat dose once in 12 hours. 28 tablet 0  . aspirin 81 MG tablet Take 81 mg by mouth daily.       No current facility-administered medications for this visit.     Allergies  Allergen Reactions  . Penicillins Rash    Per pt,can take certain penicillins!  . Statins     Cause muscle pain, edema,changes in blood work!  . Citalopram Hydrobromide      Past Medical History:  Diagnosis Date  . Allergy   . Anemia   . Fluid retention   . GERD (gastroesophageal reflux disease)   . History of snoring   . Hx of adenomatous colonic polyps 03/13/2016  . Hyperlipidemia   . Hypothyroid   . Vitamin D deficiency     Past Surgical History:  Procedure Laterality Date  . CHOLECYSTECTOMY    . COLONOSCOPY    . HERNIA REPAIR     umbilical    Social History   Socioeconomic History  . Marital status: Married    Spouse name: Not on file  . Number of children: 2  . Years of education: Not on file  . Highest education level: Not on file  Occupational History  . Not on file  Social Needs  . Financial resource strain: Not on file  .  Food insecurity:    Worry: Not on file    Inability: Not on file  . Transportation needs:    Medical: Not on file    Non-medical: Not on file  Tobacco Use  . Smoking status: Former Smoker    Types: Cigarettes    Last attempt to quit: 03/05/1994    Years since quitting: 24.2  . Smokeless tobacco: Never Used  Substance and Sexual Activity  . Alcohol use: Yes    Alcohol/week: 0.6 oz    Types: 1 Cans of beer per week    Comment: socially  . Drug use: No  . Sexual activity: Not on file  Lifestyle  . Physical activity:    Days per week: Not on file    Minutes per session: Not on file  . Stress: Not on file  Relationships  . Social connections:    Talks on phone: Not on file    Gets together: Not on file    Attends religious service: Not on file    Active member  of club or organization: Not on file    Attends meetings of clubs or organizations: Not on file    Relationship status: Not on file  . Intimate partner violence:    Fear of current or ex partner: Not on file    Emotionally abused: Not on file    Physically abused: Not on file    Forced sexual activity: Not on file  Other Topics Concern  . Not on file  Social History Narrative  . Not on file    Family History  Problem Relation Age of Onset  . Heart disease Father   . Hyperlipidemia Father   . Hypertension Father   . Diabetes Father   . Heart attack Brother     ROS: no fevers or chills, productive cough, hemoptysis, dysphasia, odynophagia, melena, hematochezia, dysuria, hematuria, rash, seizure activity, orthopnea, PND, pedal edema, claudication. Remaining systems are negative.  Physical Exam:   Blood pressure 138/72, pulse 60, height 5' 6.5" (1.689 m), weight 217 lb 9.6 oz (98.7 kg).  General:  Well developed/well nourished in NAD Skin warm/dry Patient not depressed No peripheral clubbing Back-normal HEENT-normal/normal eyelids Neck supple/normal carotid upstroke bilaterally; no bruits; no JVD; no thyromegaly chest - CTA/ normal expansion CV - RRR/normal S1 and S2; no murmurs, rubs or gallops;  PMI nondisplaced Abdomen -NT/ND, no HSM, no mass, + bowel sounds, no bruit 2+ femoral pulses, no bruits Ext-no edema, chords, 2+ DP Neuro-grossly nonfocal  ECG -normal sinus rhythm at a rate of 60.  Nonspecific ST changes.  Personally reviewed  A/P  1 strong family history of coronary artery disease-patient recently lost her brother at age 3 due to myocardial infarction.  She does not have significant dyspnea or chest pain.  However I have recommended initiation of an exercise program.  We will arrange an exercise treadmill prior to beginning.  I have also scheduled her to have a calcium score.  We discussed lifestyle modification including diet, weight loss and exercise.  She  does not smoke.  2 hyperlipidemia-she apparently has been intolerant to statins.  Is not at goal at present.  I will ask for her to be seen in the lipid clinic to optimize medications.  3 obesity-we discussed the importance of weight loss, diet and exercise.  4 hypothyroid-management per primary care.  Kirk Ruths, MD

## 2018-05-20 ENCOUNTER — Encounter: Payer: Self-pay | Admitting: Cardiology

## 2018-05-20 ENCOUNTER — Encounter (INDEPENDENT_AMBULATORY_CARE_PROVIDER_SITE_OTHER): Payer: Self-pay

## 2018-05-20 ENCOUNTER — Ambulatory Visit: Payer: Federal, State, Local not specified - PPO | Admitting: Cardiology

## 2018-05-20 VITALS — BP 138/72 | HR 60 | Ht 66.5 in | Wt 217.6 lb

## 2018-05-20 DIAGNOSIS — Z8249 Family history of ischemic heart disease and other diseases of the circulatory system: Secondary | ICD-10-CM

## 2018-05-20 DIAGNOSIS — E7800 Pure hypercholesterolemia, unspecified: Secondary | ICD-10-CM

## 2018-05-20 NOTE — Patient Instructions (Signed)
Medication Instructions:   NO CHANGE  Testing/Procedures:  Your physician has requested that you have an exercise tolerance test. For further information please visit [email or web address]. Please also follow instruction sheet, as given.   CARDIAC CALCIUM SCORE  Follow-Up:  Your physician wants you to follow-up in: [place] will receive a reminder letter in the mail two months in advance. If you don't receive a letter, please call our office to schedule the follow-up appointment.   REFERRAL TO PHARM MD LIPID CLINIC-NEW PATIENT

## 2018-06-02 ENCOUNTER — Ambulatory Visit
Admission: RE | Admit: 2018-06-02 | Discharge: 2018-06-02 | Disposition: A | Discharge status: Home / Self Care | Payer: Federal, State, Local not specified - PPO | Source: Ambulatory Visit | Attending: Internal Medicine | Admitting: Internal Medicine

## 2018-06-02 DIAGNOSIS — Z1231 Encounter for screening mammogram for malignant neoplasm of breast: Secondary | ICD-10-CM

## 2018-06-03 ENCOUNTER — Other Ambulatory Visit: Payer: Federal, State, Local not specified - PPO

## 2018-06-10 ENCOUNTER — Inpatient Hospital Stay (HOSPITAL_COMMUNITY): Admission: RE | Admit: 2018-06-10 | Payer: Federal, State, Local not specified - PPO | Source: Ambulatory Visit

## 2018-06-17 ENCOUNTER — Ambulatory Visit (INDEPENDENT_AMBULATORY_CARE_PROVIDER_SITE_OTHER)
Admission: RE | Admit: 2018-06-17 | Discharge: 2018-06-17 | Disposition: A | Discharge status: Home / Self Care | Payer: Federal, State, Local not specified - PPO | Source: Ambulatory Visit | Attending: Cardiology | Admitting: Cardiology

## 2018-06-17 DIAGNOSIS — E7800 Pure hypercholesterolemia, unspecified: Secondary | ICD-10-CM

## 2018-06-18 ENCOUNTER — Telehealth: Payer: Self-pay | Admitting: *Deleted

## 2018-06-18 NOTE — Telephone Encounter (Addendum)
Please schedule fu noncontrast chest CT 6 months  Nancy Lawrence   ----- Message from Lelon Perla, MD sent at 06/17/2018 12:22 PM EDT ----- Relatively low Ca score; continue medical therapy including lipid clinic eval and meds as they feel indicated Nancy Lawrence  Left message for pt to call

## 2018-06-19 ENCOUNTER — Encounter: Payer: Self-pay | Admitting: Pharmacist

## 2018-06-19 ENCOUNTER — Telehealth (HOSPITAL_COMMUNITY): Payer: Self-pay

## 2018-06-19 ENCOUNTER — Ambulatory Visit (INDEPENDENT_AMBULATORY_CARE_PROVIDER_SITE_OTHER): Payer: Federal, State, Local not specified - PPO | Admitting: Pharmacist

## 2018-06-19 DIAGNOSIS — E782 Mixed hyperlipidemia: Secondary | ICD-10-CM

## 2018-06-19 MED ORDER — ICOSAPENT ETHYL 1 G PO CAPS: 2.0000 g | ORAL_CAPSULE | Freq: Two times a day (BID) | ORAL | 3 refills | Status: DC

## 2018-06-19 MED ORDER — ROSUVASTATIN CALCIUM 5 MG PO TABS: ORAL_TABLET | ORAL | 1 refills | Status: DC

## 2018-06-19 NOTE — Telephone Encounter (Signed)
Encounter complete. 

## 2018-06-19 NOTE — Patient Instructions (Signed)
Lipid Clinic (pharmacy) [facility] [phone]  *STOP Tricor* *STOP zetia* *START taking Vascepa 2 grams twice daily* *START taking Crestor (rosuvastatin) 5mg  every Monday and Friday for 2 weeks; then increase to 5mg  every Monday, Wednesday and Friday is tolerating*  *REPEAT fating lipid in 2 months - week of October/14*  Cholesterol Cholesterol is a fat. Your body needs a small amount of cholesterol. Cholesterol (plaque) may build up in your blood vessels (arteries). That makes you more likely to have a heart attack or stroke. You cannot feel your cholesterol level. Having a blood test is the only way to find out if your level is high. Keep your test results. Work with your doctor to keep your cholesterol at a good level. What do the results mean?  Total cholesterol is how much cholesterol is in your blood.  LDL is bad cholesterol. This is the type that can build up. Try to have low LDL.  HDL is good cholesterol. It cleans your blood vessels and carries LDL away. Try to have high HDL.  Triglycerides are fat that the body can store or burn for energy. What are good levels of cholesterol?  Total cholesterol below 200.  LDL below 100 is good for people who have health risks. LDL below 70 is good for people who have very high risks.  HDL above 40 is good. It is best to have HDL of 60 or higher.  Triglycerides below 150. How can I lower my cholesterol? Diet Follow your diet program as told by your doctor.  Choose fish, white meat chicken, or [place] that is roasted or baked. Try not to eat red meat, fried foods, sausage, or lunch meats.  Eat lots of fresh fruits and vegetables.  Choose whole grains, beans, pasta, potatoes, and cereals.  Choose olive oil, corn oil, or canola oil. Only use small amounts.  Try not to eat butter, mayonnaise, shortening, or palm kernel oils.  Try not to eat foods with trans fats.  Choose low-fat or nonfat dairy foods. ? Drink skim or  nonfat milk. ? Eat low-fat or nonfat yogurt and cheeses. ? Try not to drink whole milk or cream. ? Try not to eat ice cream, egg yolks, or full-fat cheeses.  Healthy desserts include angel food cake, ginger snaps, animal crackers, hard candy, popsicles, and low-fat or nonfat frozen yogurt. Try not to eat pastries, cakes, pies, and cookies.  Exercise Follow your exercise program as told by your doctor.  Be more active. Try gardening, walking, and taking the stairs.  Ask your doctor about ways that you can be more active.  Medicine  Take over-the-counter and prescription medicines only as told by your doctor. This information is not intended to replace advice given to you by your health care provider. Make sure you discuss any questions you have with your health care provider. Document Released: 01/24/2009 Document Revised: 05/29/2016 Document Reviewed: 05/09/2016 Elsevier Interactive Patient Education  [employer].

## 2018-06-19 NOTE — Assessment & Plan Note (Signed)
Patient with strong family history of early CAD and mixed hyperlipidemia. LDL-c remains above 100 and TG above 200 with current combination therapy of fenofibrate and ezetimibe. We discussed positive lifestyle modifications including diet and excercise. Patient agreed on decreasing fatty food, starchy foods, and alcoholic beverages as much as possible. Noted she reports prior ADR with high dose Crestor but appropriate to re-challenge at this time.  Will discontinue fenofibrate 145 and ezetimibe 10mg . Start Vascepa 2g twice daily and rosuvastatin 5mg  2x/week. Plan to increase rosuvastatin to 3x/week if tolerated and repeat fasting lipid panel in 2 months.

## 2018-06-19 NOTE — Progress Notes (Signed)
Patient ID: Nancy Lawrence                 DOB: February 12, 1961                    MRN: [id number]     HPI: Nancy Lawrence is a 57 y.o. female patient referred to lipid clinic by Dr Stanford Breed. PMH is significant for IBS, depression, hypothyroidism, and hyperlipidemia. Patient presents today for medication management and counseling. She has been on treatments for hyperlipidemia for over 30 years and reports using statins for long time prior to developing ADR to high dose Crestor. Patient is currently on fenofibrate 145mg  and ezetimibe 10mg   with minimal therapeutic reponse. She is somehow reluctant to significant diet changes and dose not like to work out. Still will like to know how to improved her health and decrease risk of coronary disease  Current Medications:  Fenofibrate 145mg  daily Ezetimibe 10mg  daily  Intolerances:  crestor  - after year on therapy developed severe joint pain and flu like symptoms  LDL goal: 100mg /dL  Diet: try to make smart choice when eating but "loves food" in general. Likes to cook for friend and eat out about 5 times per week.  Exercise: only activities of daily living  Family History: father 1st MI at age 25yo, 85nd at age 62; gradfathe from father side MI at age 61yo; bother died of MI at age 52yo  Social History: denies tobacco use; drinks few times per week  Labs: May/2019: CHO 188, HDL 37, TG 223 and LDL-c 117 (fibrate and ezetimibe)  Past Medical History:  Diagnosis Date  . Allergy   . Anemia   . Fluid retention   . GERD (gastroesophageal reflux disease)   . History of snoring   . Hx of adenomatous colonic polyps 03/13/2016  . Hyperlipidemia   . Hypothyroid   . Vitamin D deficiency     Current Outpatient Medications on File Prior to Visit  Medication Sig Dispense Refill  . ALPRAZolam (XANAX) 0.5 MG tablet Take 1 tablet (0.5 mg total) by mouth 2 (two) times daily as needed for sleep. 60 tablet 1  . aspirin 81 MG tablet Take 81 mg by mouth  daily.      . cetirizine (ZYRTEC) 10 MG tablet Take 10 mg by mouth daily.      . furosemide (LASIX) 40 MG tablet Take 1 tablet (40 mg total) by mouth daily as needed. 90 tablet 2  . ibuprofen (ADVIL,MOTRIN) 200 MG tablet Take 200 mg by mouth every 6 (six) hours as needed.      Marland Kitchen levocetirizine (XYZAL) 5 MG tablet Take 5 mg by mouth every evening.    Marland Kitchen levonorgestrel (MIRENA) 20 MCG/24HR IUD 1 each by Intrauterine route once.      Marland Kitchen levothyroxine (SYNTHROID, LEVOTHROID) 50 MCG tablet TAKE 1 TABLET BY MOUTH EVERY DAY 90 tablet 0  . omeprazole (PRILOSEC) 20 MG capsule Take 1 capsule (20 mg total) by mouth daily. 90 capsule 3  . progesterone (PROMETRIUM) 100 MG capsule TAKE 1 CAPSULE BY MOUTH EVERY DAY 90 capsule 1  . valACYclovir (VALTREX) 1000 MG tablet Take 2 tabs by mouth at onset of symptoms and repeat dose once in 12 hours. 28 tablet 0   No current facility-administered medications on file prior to visit.     Allergies  Allergen Reactions  . Penicillins Rash    Per pt,can take certain penicillins!  . Statins     Cause muscle  pain, edema,changes in blood work!  . Citalopram Hydrobromide     Hyperlipemia, mixed Patient with strong family history of early CAD and mixed hyperlipidemia. LDL-c remains above 100 and TG above 200 with current combination therapy of fenofibrate and ezetimibe. We discussed positive lifestyle modifications including diet and excercise. Patient agreed on decreasing fatty food, starchy foods, and alcoholic beverages as much as possible. Noted she reports prior ADR with high dose Crestor but appropriate to re-challenge at this time.  Will discontinue fenofibrate 145 and ezetimibe 10mg . Start Vascepa 2g twice daily and rosuvastatin 5mg  2x/week. Plan to increase rosuvastatin to 3x/week if tolerated and repeat fasting lipid panel in 2 months.    Nancy Lawrence PharmD, BCPS, [facility] [place]  [id number] 06/19/2018 4:39 PM

## 2018-06-23 NOTE — Telephone Encounter (Signed)
pt aware of results  

## 2018-06-24 ENCOUNTER — Ambulatory Visit (HOSPITAL_COMMUNITY)
Admission: RE | Admit: 2018-06-24 | Discharge: 2018-06-24 | Disposition: A | Discharge status: Home / Self Care | Payer: Federal, State, Local not specified - PPO | Source: Ambulatory Visit | Attending: Cardiology | Admitting: Cardiology

## 2018-06-24 DIAGNOSIS — E7800 Pure hypercholesterolemia, unspecified: Secondary | ICD-10-CM | POA: Diagnosis not present

## 2018-06-25 LAB — EXERCISE TOLERANCE TEST
CHL CUP RESTING HR STRESS: 80 {beats}/min
CSEPEDS: 0 s
CSEPEW: 7 METS
CSEPHR: 104 %
Exercise duration (min): 6 min
MPHR: 163 {beats}/min
Peak HR: 171 {beats}/min
RPE: 18

## 2018-07-01 DIAGNOSIS — H1031 Unspecified acute conjunctivitis, right eye: Secondary | ICD-10-CM | POA: Diagnosis not present

## 2018-07-02 ENCOUNTER — Ambulatory Visit: Payer: Federal, State, Local not specified - PPO | Admitting: Internal Medicine

## 2018-07-02 ENCOUNTER — Encounter: Payer: Self-pay | Admitting: Internal Medicine

## 2018-07-02 VITALS — BP 110/82 | HR 75 | Temp 98.5°F | Ht 66.5 in | Wt 218.0 lb

## 2018-07-02 DIAGNOSIS — H1033 Unspecified acute conjunctivitis, bilateral: Secondary | ICD-10-CM

## 2018-07-02 DIAGNOSIS — H6501 Acute serous otitis media, right ear: Secondary | ICD-10-CM

## 2018-07-02 DIAGNOSIS — H9201 Otalgia, right ear: Secondary | ICD-10-CM | POA: Diagnosis not present

## 2018-07-02 MED ORDER — CEFTRIAXONE SODIUM 1 G IJ SOLR
1.0000 g | Freq: Once | INTRAMUSCULAR | Status: AC
  Administered 2018-07-02: 1 g via INTRAMUSCULAR

## 2018-07-02 MED ORDER — LEVOFLOXACIN 500 MG PO TABS: 500.0000 mg | ORAL_TABLET | Freq: Every day | ORAL | 0 refills | Status: DC

## 2018-07-02 NOTE — Patient Instructions (Addendum)
Levaquin 500 mg daily x 10 days. Rocephin one gram IM.  Use eyedrops on hand for 5 days

## 2018-07-02 NOTE — Progress Notes (Signed)
   Subjective:    Patient ID: Nancy Lawrence, female    DOB: 1961/06/16, 57 y.o.   MRN: [id number]  HPI 57 year old Female in with right ear pain.  This is rather acute.  Reviewed calcium score that was WNL when seen by  cardiologist and had neg ETT except for hypertensive response.  That will need to be monitored.  Brother died of MI.  Onset eye irritation Monday and onset ear ache Tuesday    Review of Systems see above-seems chronically anxious     Objective:   Physical Exam  Skin warm and dry.  Nodes none.  Right TM is full but not red.  Left TM is clear.  Neck is supple.  Conjunctivae are inflamed bilaterally without drainage.  Chest clear to auscultation.  Pharynx is clear.      Assessment & Plan:  Acute right serous otitis media  Bilateral conjunctivitis  Plan: She has antibiotic eyedrops to use for conjunctivitis.  Rocephin 1 g IM.  Levaquin 500 mg daily for 10 days.

## 2018-07-21 DIAGNOSIS — H16141 Punctate keratitis, right eye: Secondary | ICD-10-CM | POA: Diagnosis not present

## 2018-07-21 DIAGNOSIS — H18211 Corneal edema secondary to contact lens, right eye: Secondary | ICD-10-CM | POA: Diagnosis not present

## 2018-07-22 DIAGNOSIS — H16141 Punctate keratitis, right eye: Secondary | ICD-10-CM | POA: Diagnosis not present

## 2018-08-11 ENCOUNTER — Other Ambulatory Visit: Payer: Self-pay | Admitting: Internal Medicine

## 2018-08-15 ENCOUNTER — Other Ambulatory Visit: Payer: Self-pay | Admitting: Internal Medicine

## 2018-08-17 NOTE — Telephone Encounter (Signed)
OK to refill Zetia but GYN has to refill progesterone.

## 2018-09-18 DIAGNOSIS — M5136 Other intervertebral disc degeneration, lumbar region: Secondary | ICD-10-CM | POA: Diagnosis not present

## 2018-09-18 DIAGNOSIS — M47816 Spondylosis without myelopathy or radiculopathy, lumbar region: Secondary | ICD-10-CM | POA: Diagnosis not present

## 2018-09-18 DIAGNOSIS — M546 Pain in thoracic spine: Secondary | ICD-10-CM | POA: Diagnosis not present

## 2018-09-18 DIAGNOSIS — M5416 Radiculopathy, lumbar region: Secondary | ICD-10-CM | POA: Diagnosis not present

## 2018-09-18 DIAGNOSIS — M549 Dorsalgia, unspecified: Secondary | ICD-10-CM | POA: Diagnosis not present

## 2018-09-18 DIAGNOSIS — Q7649 Other congenital malformations of spine, not associated with scoliosis: Secondary | ICD-10-CM | POA: Diagnosis not present

## 2018-09-21 ENCOUNTER — Other Ambulatory Visit: Payer: Self-pay | Admitting: Neurosurgery

## 2018-09-21 ENCOUNTER — Other Ambulatory Visit: Payer: Self-pay

## 2018-09-21 DIAGNOSIS — M5416 Radiculopathy, lumbar region: Secondary | ICD-10-CM

## 2018-09-21 MED ORDER — FUROSEMIDE 40 MG PO TABS: 40.0000 mg | ORAL_TABLET | Freq: Every day | ORAL | 1 refills | Status: DC | PRN

## 2018-09-21 MED ORDER — PROGESTERONE MICRONIZED 100 MG PO CAPS: ORAL_CAPSULE | ORAL | 1 refills | Status: DC

## 2018-10-03 ENCOUNTER — Ambulatory Visit
Admission: RE | Admit: 2018-10-03 | Discharge: 2018-10-03 | Disposition: A | Discharge status: Home / Self Care | Payer: Federal, State, Local not specified - PPO | Source: Ambulatory Visit | Attending: Neurosurgery | Admitting: Neurosurgery

## 2018-10-03 DIAGNOSIS — M48061 Spinal stenosis, lumbar region without neurogenic claudication: Secondary | ICD-10-CM | POA: Diagnosis not present

## 2018-10-03 DIAGNOSIS — M5416 Radiculopathy, lumbar region: Secondary | ICD-10-CM

## 2018-10-03 DIAGNOSIS — M5116 Intervertebral disc disorders with radiculopathy, lumbar region: Secondary | ICD-10-CM | POA: Diagnosis not present

## 2018-10-03 MED ORDER — GADOBENATE DIMEGLUMINE 529 MG/ML IV SOLN
20.0000 mL | Freq: Once | INTRAVENOUS | Status: AC | PRN
  Administered 2018-10-03: 20 mL via INTRAVENOUS

## 2018-10-14 DIAGNOSIS — M47816 Spondylosis without myelopathy or radiculopathy, lumbar region: Secondary | ICD-10-CM | POA: Diagnosis not present

## 2018-10-14 DIAGNOSIS — M5416 Radiculopathy, lumbar region: Secondary | ICD-10-CM | POA: Diagnosis not present

## 2018-10-14 DIAGNOSIS — M5136 Other intervertebral disc degeneration, lumbar region: Secondary | ICD-10-CM | POA: Diagnosis not present

## 2018-10-14 DIAGNOSIS — Q7649 Other congenital malformations of spine, not associated with scoliosis: Secondary | ICD-10-CM | POA: Diagnosis not present

## 2018-10-15 ENCOUNTER — Other Ambulatory Visit: Payer: Self-pay | Admitting: Cardiology

## 2018-10-15 NOTE — Telephone Encounter (Signed)
Returned call to pt she states that she was given samples of Vascepa in august and just recently started them. She also jst started gen. Crestor. She was wondering if refills were called in so she could pick them up because she is running out of medication. Informed pt that refills were sent to her requested pharmacy in august, she will cb if anything going wrong and pharmacy is unable to fill.

## 2018-10-15 NOTE — Telephone Encounter (Signed)
Pt calling requesting a refill on Vascepa 1 g capsule sent to [facility] in [place]. Pt would like a call back at [phone]. Please address

## 2018-10-19 DIAGNOSIS — M5416 Radiculopathy, lumbar region: Secondary | ICD-10-CM | POA: Diagnosis not present

## 2018-10-29 DIAGNOSIS — M5416 Radiculopathy, lumbar region: Secondary | ICD-10-CM | POA: Diagnosis not present

## 2018-11-02 DIAGNOSIS — M5416 Radiculopathy, lumbar region: Secondary | ICD-10-CM | POA: Diagnosis not present

## 2018-11-05 DIAGNOSIS — M5416 Radiculopathy, lumbar region: Secondary | ICD-10-CM | POA: Diagnosis not present

## 2018-11-12 DIAGNOSIS — M5416 Radiculopathy, lumbar region: Secondary | ICD-10-CM | POA: Diagnosis not present

## 2018-11-16 DIAGNOSIS — M5416 Radiculopathy, lumbar region: Secondary | ICD-10-CM | POA: Diagnosis not present

## 2018-11-19 DIAGNOSIS — M5416 Radiculopathy, lumbar region: Secondary | ICD-10-CM | POA: Diagnosis not present

## 2018-11-23 DIAGNOSIS — M5416 Radiculopathy, lumbar region: Secondary | ICD-10-CM | POA: Diagnosis not present

## 2018-11-26 DIAGNOSIS — M5416 Radiculopathy, lumbar region: Secondary | ICD-10-CM | POA: Diagnosis not present

## 2018-11-28 ENCOUNTER — Other Ambulatory Visit: Payer: Self-pay | Admitting: Cardiology

## 2018-12-01 DIAGNOSIS — M5416 Radiculopathy, lumbar region: Secondary | ICD-10-CM | POA: Diagnosis not present

## 2018-12-03 DIAGNOSIS — M5416 Radiculopathy, lumbar region: Secondary | ICD-10-CM | POA: Diagnosis not present

## 2018-12-04 DIAGNOSIS — Z6836 Body mass index (BMI) 36.0-36.9, adult: Secondary | ICD-10-CM | POA: Diagnosis not present

## 2018-12-04 DIAGNOSIS — M5416 Radiculopathy, lumbar region: Secondary | ICD-10-CM | POA: Diagnosis not present

## 2018-12-04 DIAGNOSIS — Q7649 Other congenital malformations of spine, not associated with scoliosis: Secondary | ICD-10-CM | POA: Diagnosis not present

## 2018-12-04 DIAGNOSIS — M47816 Spondylosis without myelopathy or radiculopathy, lumbar region: Secondary | ICD-10-CM | POA: Diagnosis not present

## 2018-12-07 DIAGNOSIS — M5416 Radiculopathy, lumbar region: Secondary | ICD-10-CM | POA: Diagnosis not present

## 2018-12-17 ENCOUNTER — Ambulatory Visit: Payer: Federal, State, Local not specified - PPO | Admitting: Internal Medicine

## 2018-12-17 ENCOUNTER — Encounter: Payer: Self-pay | Admitting: Internal Medicine

## 2018-12-17 VITALS — BP 130/90 | HR 84 | Temp 98.6°F | Ht 66.5 in | Wt 222.0 lb

## 2018-12-17 DIAGNOSIS — J22 Unspecified acute lower respiratory infection: Secondary | ICD-10-CM

## 2018-12-17 MED ORDER — CEFTRIAXONE SODIUM 1 G IJ SOLR
1.0000 g | Freq: Once | INTRAMUSCULAR | Status: AC
  Administered 2018-12-17: 1 g via INTRAMUSCULAR

## 2018-12-17 MED ORDER — LEVOFLOXACIN 500 MG PO TABS: 500.0000 mg | ORAL_TABLET | Freq: Every day | ORAL | 1 refills | Status: DC

## 2018-12-17 NOTE — Progress Notes (Signed)
   Subjective:    Patient ID: Nancy Lawrence, female    DOB: [birth year], 58 y.o.   MRN: [id number]  HPI 58 year old female in today with respiratory infection symptoms.  Does not feel that she has the flu.  No fever or shaking chills.  Has cough and congestion that has been present for several days.  Has been placed on Crestor and Vascepa by Cardiology.  She had an exercise tolerance test in August 2019 and blood pressure demonstrated a hypertensive response to exercise.  There was no ST segment deviation during stress.  Rare PVCs.   Review of Systems see above     Objective:   Physical Exam Blood pressure 130/90, pulse 84, temperature 98.6, pulse oximetry 94% BMI 35.29  Skin warm and dry.  Nodes none.  Neck is supple without JVD thyromegaly or carotid bruits.  TMs are slightly full bilaterally but not red.  Pharynx very slightly injected.  Chest is clear to auscultation without rales or wheezing.       Assessment & Plan:  Acute lower respiratory infection see below  Family history of heart disease in her brother who died recently of an MI at age 50.  Patient had negative exercise tolerance test recently.  She has been placed on cholesterol management with 2 drugs.  Plan: Levaquin 500 mg daily for 10 days.  Rocephin 1 g a.m. given in office.  Rest and drink plenty of fluids.

## 2018-12-22 IMAGING — MG DIGITAL SCREENING BILATERAL MAMMOGRAM WITH TOMO AND CAD
8 series · 8 of 24 positions shown · non-contrast
Comparison: Previous exam(s).

CLINICAL DATA: Screening.

EXAM:
DIGITAL SCREENING BILATERAL MAMMOGRAM WITH TOMO AND CAD

[L CC synth-2D]
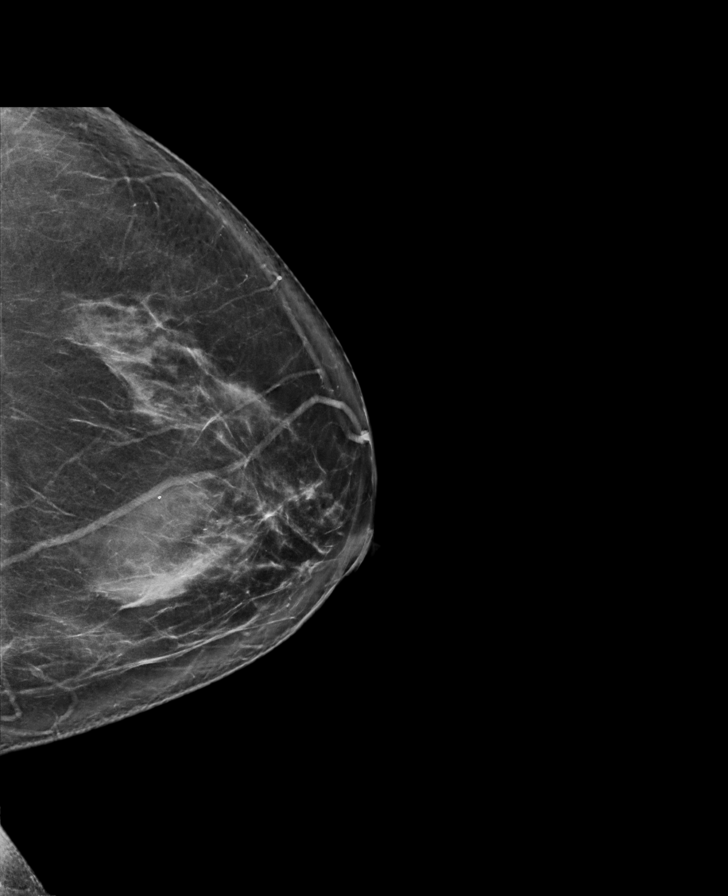

[L MLO synth-2D]
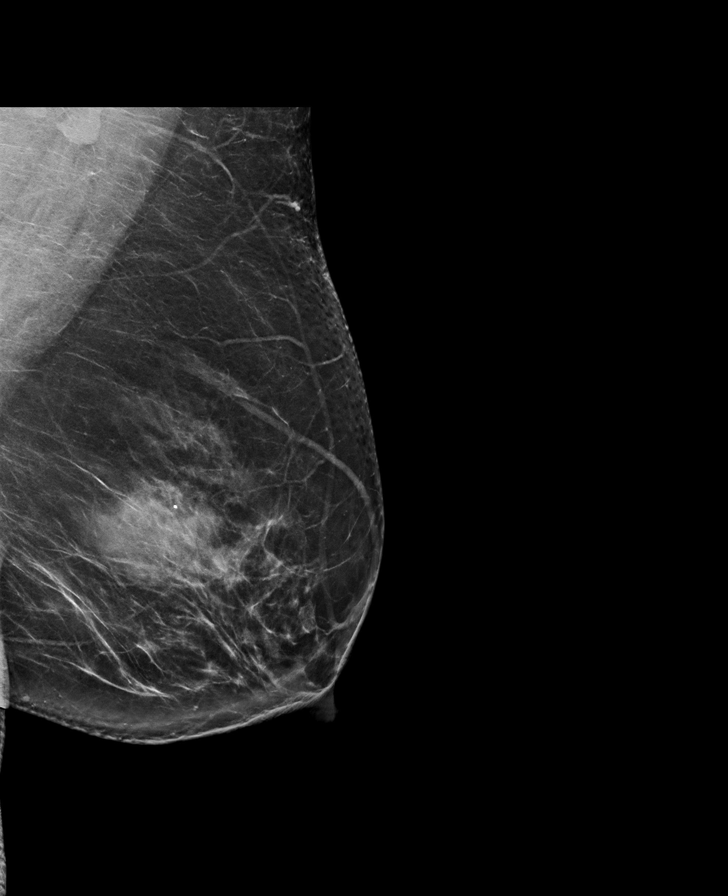

[R MLO synth-2D]
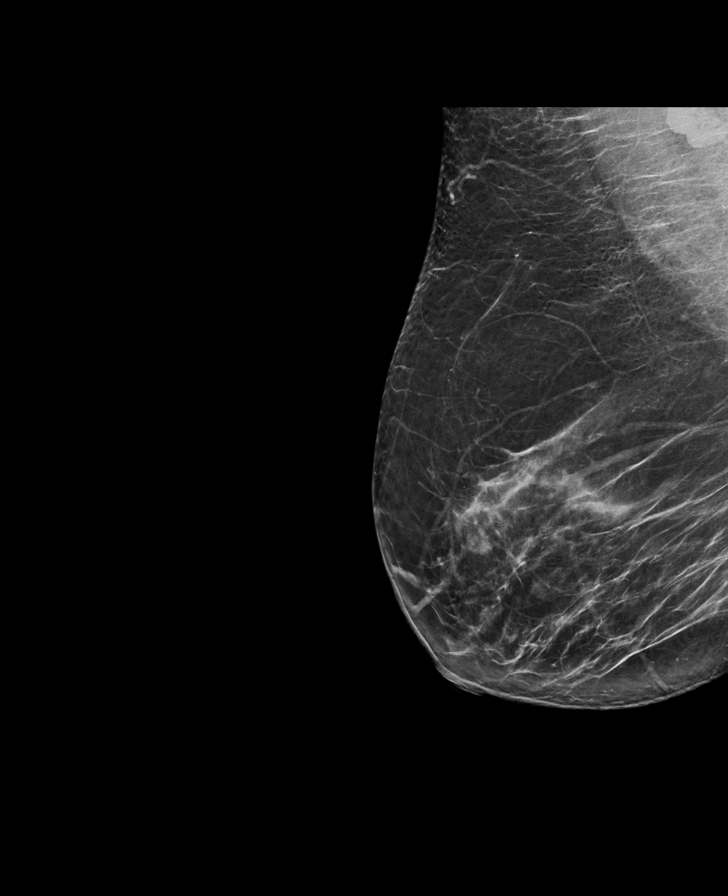

[R CC synth-2D]
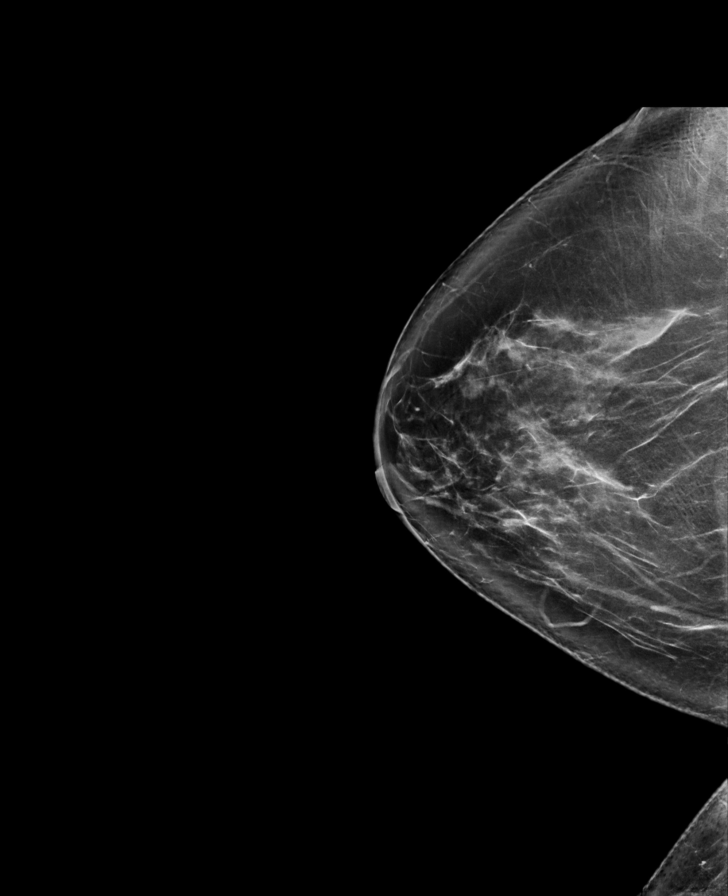

[L CC tomo · tomo slice 40/79.0]
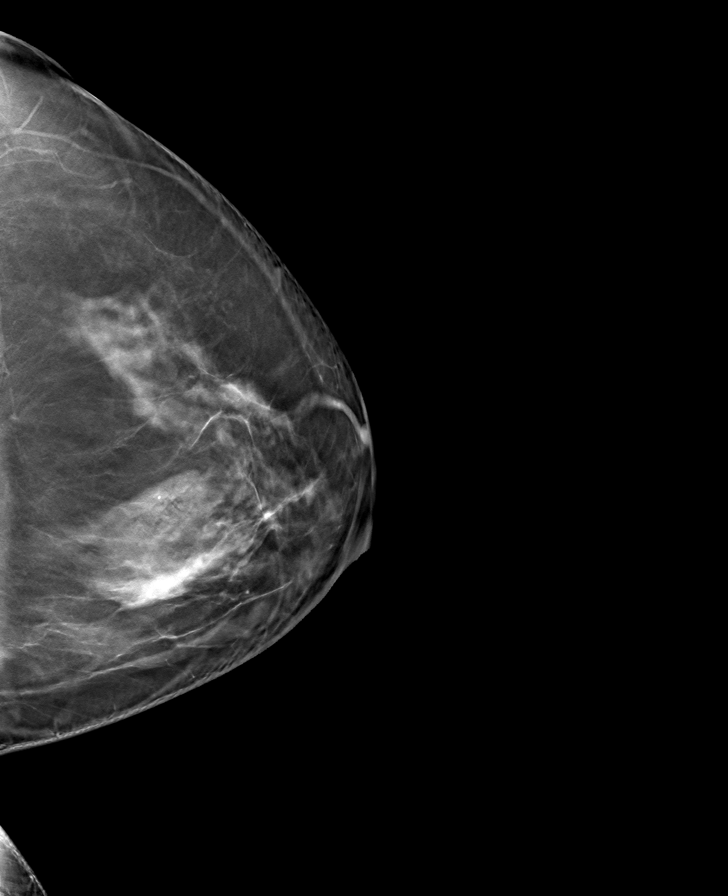

[R MLO tomo · tomo slice 39/77.0]
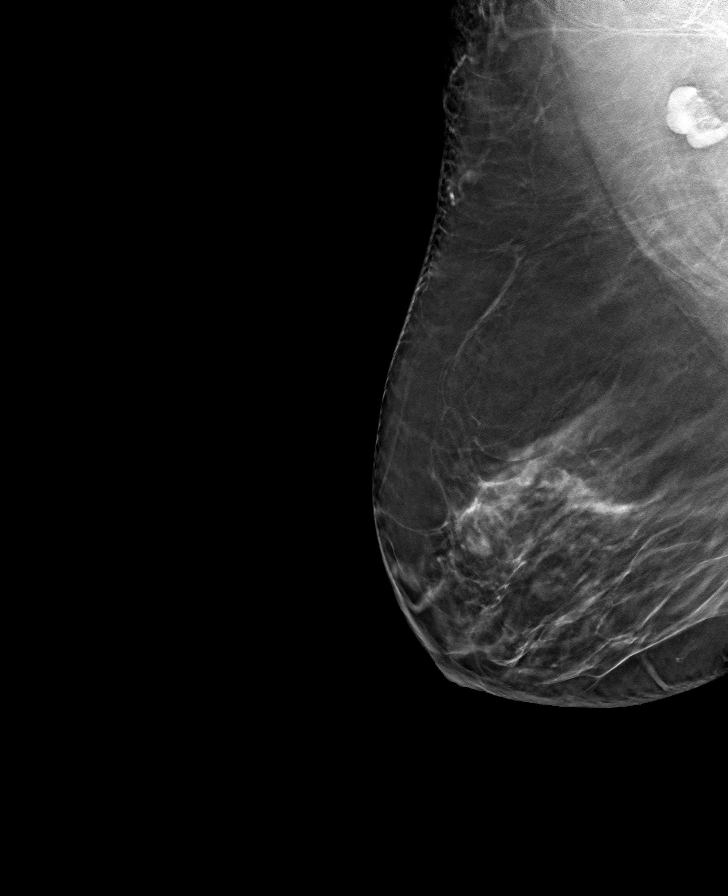

[R CC tomo · tomo slice 42/83.0]
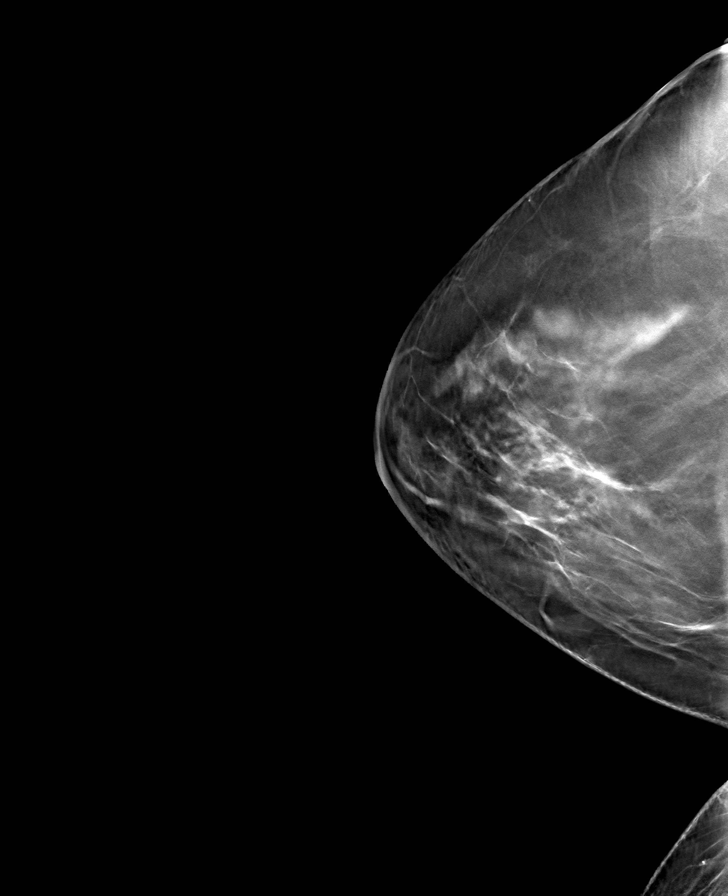

[L MLO tomo · tomo slice 41/81.0]
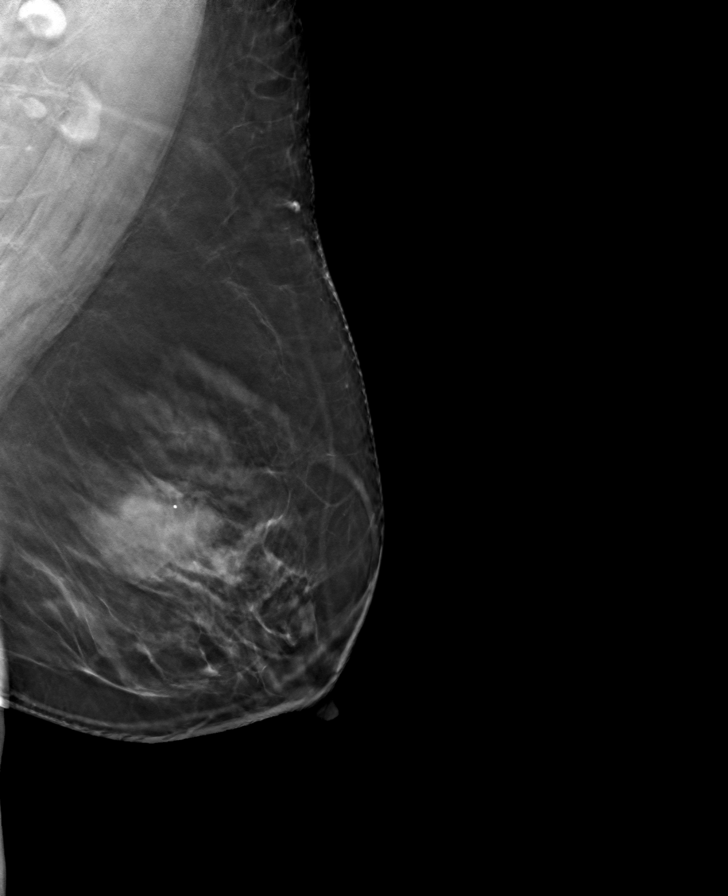

[8 of 24 positions shown; findings below may reference images not displayed]

ACR Breast Density Category b: There are scattered areas of
fibroglandular density.
FINDINGS: There are no findings suspicious for malignancy. Images were
processed with CAD.
IMPRESSION: No mammographic evidence of malignancy. A result letter of this
screening mammogram will be mailed directly to the patient.

RECOMMENDATION:
Screening mammogram in one year. (Code:[id number])

BI-RADS CATEGORY  1: Negative.

## 2018-12-30 ENCOUNTER — Telehealth: Payer: Self-pay | Admitting: *Deleted

## 2018-12-30 NOTE — Telephone Encounter (Signed)
Left message for patient to call regarding 01/12/19 appointment---Needs to be with th LIPID CLINIC (Pharm D)---not a coumadin appointment

## 2018-12-31 NOTE — Telephone Encounter (Signed)
Left message for patient to call and reschedule 01/12/19 4 pm appointment.  This should be a LIPID consult with the Pharm D

## 2019-01-01 NOTE — Telephone Encounter (Signed)
Left message for patient to call and reschedule 01/12/19 4:00pm appointm,ent---needs to be scheduled with the Pharm D (30 minute appointment)

## 2019-01-03 NOTE — Patient Instructions (Signed)
Take Levaquin 500 mg daily for 10 days.  Rocephin 1 g IM given in office.  Rest and drink plenty of fluids.

## 2019-01-04 NOTE — Telephone Encounter (Signed)
Left message for patient to call and reschedule 01/12/19 appointment kf

## 2019-01-11 ENCOUNTER — Telehealth: Payer: Self-pay | Admitting: *Deleted

## 2019-01-11 DIAGNOSIS — R918 Other nonspecific abnormal finding of lung field: Secondary | ICD-10-CM

## 2019-01-11 NOTE — Telephone Encounter (Signed)
Spoke with pt, per CTA of the chest from 06-2018, patient needs a follow up CT wo for lung nodule. CT scheduled at the [address] office 01-28-2019 @ 4:30 pm.

## 2019-01-12 ENCOUNTER — Ambulatory Visit: Payer: Federal, State, Local not specified - PPO

## 2019-01-26 ENCOUNTER — Other Ambulatory Visit: Payer: Self-pay

## 2019-01-26 ENCOUNTER — Ambulatory Visit (INDEPENDENT_AMBULATORY_CARE_PROVIDER_SITE_OTHER): Payer: Federal, State, Local not specified - PPO | Admitting: Pharmacist Clinician (PhC)/ Clinical Pharmacy Specialist

## 2019-01-26 DIAGNOSIS — E785 Hyperlipidemia, unspecified: Secondary | ICD-10-CM | POA: Diagnosis not present

## 2019-01-26 DIAGNOSIS — E782 Mixed hyperlipidemia: Secondary | ICD-10-CM | POA: Diagnosis not present

## 2019-01-26 NOTE — Progress Notes (Signed)
Patient ID: Nancy Lawrence                 DOB: 07/01/1961                    MRN: [id number]     HPI: Nancy Lawrence is a 58 y.o. female patient of Dr Stanford Breed, who is in the lipid clinic today for follow up.  In addition to hyperlipidemia, her medical history is significant for IBS, depression, and hypothyroidism.  She has been treated for hyperlipidemia for over 30 years and reports using statins for long time prior to developing ADR to high dose Crestor.   She saw Raquel Rodriguez-Guzman last August and was taken off fenofibrate and ezetimibe, as they did not appear to be affecting her cholesterol significantly.  She was instead started on Vascepa 2 gm bid and rosuvastatin 5 mg three times weekly.  She has done well with this and had fasting labs drawn this morning in her office (she works for [employer]).    Current Medications:  vascepa 2 gm bid - started 2-3 months ago Rosuvastatin 5 mg 3 x weekly  (MWF) -   Intolerances:  crestor high dose - after year on therapy developed severe joint pain and flu like symptoms  LDL goal: 100mg /dL  Diet: try to make smart choice when eating but "loves food" in general. Likes to cook for friends and eats out about 5 times per week.  Exercise: only activities of daily living  Family History:   father 1st MI at age 68yo, 62nd at 67 - living now 39;  PGF gradfather- MI at age 68yo; P uncle died of MI at age 36   One brother -DM, htn, lipids  2 daughters - 1 RN, very health conscious, other not so much  Social History: denies tobacco use; drinks few times per week  Labs:  May 2019: CHO 188, HDL 37, TG 223 and LDL-c 117 (fibrate and ezetimibe)  Past Medical History:  Diagnosis Date  . Allergy   . Anemia   . Fluid retention   . GERD (gastroesophageal reflux disease)   . History of snoring   . Hx of adenomatous colonic polyps 03/13/2016  . Hyperlipidemia   . Hypothyroid   . Vitamin D deficiency     Current Outpatient  Medications on File Prior to Visit  Medication Sig Dispense Refill  . ALPRAZolam (XANAX) 0.5 MG tablet Take 1 tablet (0.5 mg total) by mouth 2 (two) times daily as needed for sleep. 60 tablet 1  . cetirizine (ZYRTEC) 10 MG tablet Take 10 mg by mouth daily.      . furosemide (LASIX) 40 MG tablet Take 1 tablet (40 mg total) by mouth daily as needed. 90 tablet 1  . ibuprofen (ADVIL,MOTRIN) 200 MG tablet Take 200 mg by mouth every 6 (six) hours as needed.      Vanessa Kick Ethyl (VASCEPA) 1 g CAPS Take 2 capsules (2 g total) by mouth 2 (two) times daily. To replace Tricor (Patient taking differently: Take 2 g by mouth 2 (two) times daily. 2 tablets am and 2 tablets at night) 120 capsule 3  . levonorgestrel (MIRENA) 20 MCG/24HR IUD 1 each by Intrauterine route once.      Marland Kitchen levothyroxine (SYNTHROID, LEVOTHROID) 50 MCG tablet TAKE 1 TABLET BY MOUTH EVERY DAY 90 tablet 2  . omeprazole (PRILOSEC) 20 MG capsule Take 1 capsule (20 mg total) by mouth daily. 90 capsule 3  .  rosuvastatin (CRESTOR) 5 MG tablet TAKE 1 TAB EVERY MONDAY AND FRIDAY FOR 2 WEEKS,INCREASE TO 1 TAB EVERY MON, WED AND FRI IF TOLERATED 30 tablet 6  . valACYclovir (VALTREX) 1000 MG tablet Take 2 tabs by mouth at onset of symptoms and repeat dose once in 12 hours. 28 tablet 0  . levocetirizine (XYZAL) 5 MG tablet Take 5 mg by mouth every evening.    . progesterone (PROMETRIUM) 100 MG capsule TAKE 1 CAPSULE BY MOUTH EVERY DAY 90 capsule 1   No current facility-administered medications on file prior to visit.     Allergies  Allergen Reactions  . Penicillins Rash    Per pt,can take certain penicillins!  . Statins     Cause muscle pain, edema,changes in blood work!  . Citalopram Hydrobromide     Hyperlipemia, mixed Patient with mixed hyperlipidemia, tolerating current medications well.  [facility] and will fax those to [place] in the next day or two.  I have advised her to continue with her current  medications until I can review the labs.    Tommy Medal PharmD CPP [facility] [address] [place] [address] 01/27/2019 11:48 AM

## 2019-01-26 NOTE — Patient Instructions (Signed)
Fax [place] your cholesterol labs wen you get them - [phone]  Call [phone] - if you have any questions or concerns.  Kristin/Raquel  Continue to work on healthy food options.  We will send a referral request for you to see a nutritionist.

## 2019-01-27 ENCOUNTER — Encounter: Payer: Self-pay | Admitting: Pharmacist Clinician (PhC)/ Clinical Pharmacy Specialist

## 2019-01-27 NOTE — Assessment & Plan Note (Signed)
Patient with mixed hyperlipidemia, tolerating current medications well.  She [facility] and will fax those to [place] in the next day or two.  I have advised her to continue with her current medications until I can review the labs.

## 2019-01-28 ENCOUNTER — Inpatient Hospital Stay: Admission: RE | Admit: 2019-01-28 | Payer: Federal, State, Local not specified - PPO | Source: Ambulatory Visit

## 2019-02-13 ENCOUNTER — Other Ambulatory Visit: Payer: Self-pay | Admitting: Internal Medicine

## 2019-02-26 ENCOUNTER — Ambulatory Visit (INDEPENDENT_AMBULATORY_CARE_PROVIDER_SITE_OTHER): Payer: Federal, State, Local not specified - PPO | Admitting: Internal Medicine

## 2019-02-26 ENCOUNTER — Telehealth: Payer: Self-pay

## 2019-02-26 ENCOUNTER — Encounter: Payer: Self-pay | Admitting: Internal Medicine

## 2019-02-26 DIAGNOSIS — L309 Dermatitis, unspecified: Secondary | ICD-10-CM | POA: Diagnosis not present

## 2019-02-26 DIAGNOSIS — H9202 Otalgia, left ear: Secondary | ICD-10-CM | POA: Diagnosis not present

## 2019-02-26 DIAGNOSIS — H60502 Unspecified acute noninfective otitis externa, left ear: Secondary | ICD-10-CM

## 2019-02-26 DIAGNOSIS — L409 Psoriasis, unspecified: Secondary | ICD-10-CM

## 2019-02-26 MED ORDER — LEVOFLOXACIN 500 MG PO TABS: 500.0000 mg | ORAL_TABLET | Freq: Every day | ORAL | 0 refills | Status: DC

## 2019-02-26 MED ORDER — NEOMYCIN-POLYMYXIN-HC 1 % OT SOLN: 3.0000 [drp] | Freq: Four times a day (QID) | OTIC | 0 refills | Status: AC

## 2019-02-26 MED ORDER — CEFTRIAXONE SODIUM 1 G IJ SOLR
1.0000 g | Freq: Once | INTRAMUSCULAR | Status: AC
Start: 2019-02-26 — End: 2019-02-26
  Administered 2019-02-26: 1 g via INTRAMUSCULAR

## 2019-02-26 NOTE — Patient Instructions (Signed)
She will come to office at 2:30 pm for one gram IM Rocephin.  Refill Levaquin 500 mg daily for 10 days and Cortisporin otic suspension 3-4 drops in left ear canal 4 times a day for 5 days.

## 2019-02-26 NOTE — Telephone Encounter (Signed)
Patient called states she has a flare up of eczema on the back of her head and would like a prescription for that. She also said she has an ear infection L ear it is swollen and sore and it's hard to hear she said she has had 2 ear infections back to back and she was given a predisone shot and that help she wants to know if you could prescribe something for her? She said an antibiotic on its own won't clear her ear she said prednisone will.

## 2019-02-26 NOTE — Telephone Encounter (Signed)
Scheduled Virtual Visit °

## 2019-02-26 NOTE — Telephone Encounter (Signed)
Schedule virtual visit 

## 2019-03-09 ENCOUNTER — Other Ambulatory Visit: Payer: Self-pay | Admitting: Cardiology

## 2019-03-09 NOTE — Telephone Encounter (Signed)
Vascepa 1 gm refilled,

## 2019-03-10 NOTE — Progress Notes (Signed)
   Subjective:    Patient ID: Nancy Lawrence, female    DOB: 02-16-1961, 58 y.o.   MRN: [id number]  HPI Patient called with complaint of eczema and ear pain.  A visit was arranged using interactive audio and video telecommunications due to the coronavirus pandemic.Marland Kitchen  She agreed to a visit in this format.  Patient identified as Nancy Lawrence. Tilson a patient in this practice.  2 identifiers were used to identify the patient.  Patient has longstanding history of seborrhea/psoriasis of scalp.  Scalp has been itching worse recently.  Also has developed left ear pain and some drainage from left ear canal.  Has not been swimming.  In August 2019 she was treated for an acute right serous otitis media with Levaquin and Rocephin.  She felt that this treatment helped a great deal.  Feels that the symptoms are similar.    Review of Systems     Objective:   Physical Exam Not able to examine ear canal during virtual visit but based on history it is similar to August 2019 visit.  She does have chronic seborrhea/psoriasis of the scalp.  She is asking for an injection of Rocephin which helped her immensely the last time she had a similar issue.     Assessment & Plan:  Left otitis externa  Psoriasis of scalp  Plan: She will come to the office at 2:30 PM to receive 1 g IM Rocephin.  Refill Levaquin 500 mg daily for 10 days for otitis media/externa and apply Cortisporin otic suspension in left ear canal 4 times a day for 5 days.

## 2019-03-24 ENCOUNTER — Encounter: Payer: Self-pay | Admitting: Internal Medicine

## 2019-06-27 ENCOUNTER — Other Ambulatory Visit: Payer: Self-pay | Admitting: Internal Medicine

## 2019-07-05 ENCOUNTER — Other Ambulatory Visit: Payer: Self-pay

## 2019-07-05 MED ORDER — LEVOTHYROXINE SODIUM 50 MCG PO TABS: 50.0000 ug | ORAL_TABLET | Freq: Every day | ORAL | 0 refills | Status: DC

## 2019-07-29 ENCOUNTER — Other Ambulatory Visit: Payer: Self-pay | Admitting: Internal Medicine

## 2019-07-29 DIAGNOSIS — Z1231 Encounter for screening mammogram for malignant neoplasm of breast: Secondary | ICD-10-CM

## 2019-07-30 ENCOUNTER — Ambulatory Visit
Admission: RE | Admit: 2019-07-30 | Discharge: 2019-07-30 | Disposition: A | Discharge status: Home / Self Care | Payer: Federal, State, Local not specified - PPO | Source: Ambulatory Visit | Attending: Internal Medicine | Admitting: Internal Medicine

## 2019-07-30 ENCOUNTER — Other Ambulatory Visit: Payer: Self-pay

## 2019-07-30 DIAGNOSIS — Z1231 Encounter for screening mammogram for malignant neoplasm of breast: Secondary | ICD-10-CM | POA: Diagnosis not present

## 2019-08-03 ENCOUNTER — Other Ambulatory Visit: Payer: Self-pay | Admitting: Internal Medicine

## 2019-08-03 DIAGNOSIS — R928 Other abnormal and inconclusive findings on diagnostic imaging of breast: Secondary | ICD-10-CM

## 2019-08-09 DIAGNOSIS — M5136 Other intervertebral disc degeneration, lumbar region: Secondary | ICD-10-CM | POA: Diagnosis not present

## 2019-08-09 DIAGNOSIS — M5030 Other cervical disc degeneration, unspecified cervical region: Secondary | ICD-10-CM | POA: Diagnosis not present

## 2019-08-09 DIAGNOSIS — M961 Postlaminectomy syndrome, not elsewhere classified: Secondary | ICD-10-CM | POA: Diagnosis not present

## 2019-08-09 DIAGNOSIS — M542 Cervicalgia: Secondary | ICD-10-CM | POA: Diagnosis not present

## 2019-08-10 DIAGNOSIS — J302 Other seasonal allergic rhinitis: Secondary | ICD-10-CM | POA: Diagnosis not present

## 2019-08-10 DIAGNOSIS — J343 Hypertrophy of nasal turbinates: Secondary | ICD-10-CM | POA: Insufficient documentation

## 2019-08-10 DIAGNOSIS — H6123 Impacted cerumen, bilateral: Secondary | ICD-10-CM | POA: Insufficient documentation

## 2019-08-10 DIAGNOSIS — J342 Deviated nasal septum: Secondary | ICD-10-CM | POA: Diagnosis not present

## 2019-08-10 DIAGNOSIS — H6063 Unspecified chronic otitis externa, bilateral: Secondary | ICD-10-CM | POA: Diagnosis not present

## 2019-08-12 ENCOUNTER — Other Ambulatory Visit: Payer: Self-pay

## 2019-08-12 ENCOUNTER — Ambulatory Visit
Admission: RE | Admit: 2019-08-12 | Discharge: 2019-08-12 | Disposition: A | Discharge status: Home / Self Care | Payer: Federal, State, Local not specified - PPO | Source: Ambulatory Visit | Attending: Internal Medicine | Admitting: Internal Medicine

## 2019-08-12 DIAGNOSIS — R922 Inconclusive mammogram: Secondary | ICD-10-CM | POA: Diagnosis not present

## 2019-08-12 DIAGNOSIS — R928 Other abnormal and inconclusive findings on diagnostic imaging of breast: Secondary | ICD-10-CM | POA: Diagnosis not present

## 2019-08-12 DIAGNOSIS — N6012 Diffuse cystic mastopathy of left breast: Secondary | ICD-10-CM | POA: Diagnosis not present

## 2019-08-27 DIAGNOSIS — M5030 Other cervical disc degeneration, unspecified cervical region: Secondary | ICD-10-CM | POA: Diagnosis not present

## 2019-08-30 DIAGNOSIS — M5030 Other cervical disc degeneration, unspecified cervical region: Secondary | ICD-10-CM | POA: Insufficient documentation

## 2019-09-06 ENCOUNTER — Other Ambulatory Visit: Payer: Self-pay

## 2019-09-08 ENCOUNTER — Other Ambulatory Visit: Payer: Self-pay

## 2019-09-08 MED ORDER — VASCEPA 1 G PO CAPS: ORAL_CAPSULE | ORAL | 3 refills | Status: DC

## 2019-09-21 DIAGNOSIS — M5136 Other intervertebral disc degeneration, lumbar region: Secondary | ICD-10-CM | POA: Diagnosis not present

## 2019-09-21 DIAGNOSIS — M5030 Other cervical disc degeneration, unspecified cervical region: Secondary | ICD-10-CM | POA: Diagnosis not present

## 2019-09-22 ENCOUNTER — Telehealth: Payer: Self-pay

## 2019-09-22 ENCOUNTER — Encounter: Payer: Self-pay | Admitting: Cardiology

## 2019-09-22 ENCOUNTER — Telehealth (INDEPENDENT_AMBULATORY_CARE_PROVIDER_SITE_OTHER): Payer: Federal, State, Local not specified - PPO | Admitting: Cardiology

## 2019-09-22 VITALS — Ht 66.5 in | Wt 218.0 lb

## 2019-09-22 DIAGNOSIS — Z13220 Encounter for screening for lipoid disorders: Secondary | ICD-10-CM | POA: Diagnosis not present

## 2019-09-22 DIAGNOSIS — E782 Mixed hyperlipidemia: Secondary | ICD-10-CM

## 2019-09-22 DIAGNOSIS — Z0000 Encounter for general adult medical examination without abnormal findings: Secondary | ICD-10-CM | POA: Diagnosis not present

## 2019-09-22 DIAGNOSIS — Z130 Encounter for screening for diseases of the blood and blood-forming organs and certain disorders involving the immune mechanism: Secondary | ICD-10-CM | POA: Diagnosis not present

## 2019-09-22 DIAGNOSIS — Z131 Encounter for screening for diabetes mellitus: Secondary | ICD-10-CM | POA: Diagnosis not present

## 2019-09-22 DIAGNOSIS — Z8719 Personal history of other diseases of the digestive system: Secondary | ICD-10-CM

## 2019-09-22 DIAGNOSIS — Z1329 Encounter for screening for other suspected endocrine disorder: Secondary | ICD-10-CM | POA: Diagnosis not present

## 2019-09-22 DIAGNOSIS — Z8249 Family history of ischemic heart disease and other diseases of the circulatory system: Secondary | ICD-10-CM

## 2019-09-22 DIAGNOSIS — R911 Solitary pulmonary nodule: Secondary | ICD-10-CM

## 2019-09-22 DIAGNOSIS — IMO0001 Reserved for inherently not codable concepts without codable children: Secondary | ICD-10-CM | POA: Insufficient documentation

## 2019-09-22 NOTE — Telephone Encounter (Signed)
Left message for patient with Luke's recommendations and left our fax number on patients voicemail, advised patient to follow up on a as needed and that someone from our scheduling dept will contact her to schedule her follow up appt with the lipid clinic. Advised patient to contact the office with amy questions or concerns. AVS printed and mailed to patient.

## 2019-09-22 NOTE — Telephone Encounter (Signed)
Left message for patient to contact office to start virtual visit.

## 2019-09-22 NOTE — Telephone Encounter (Signed)

## 2019-09-22 NOTE — Progress Notes (Signed)
OK. Thank you

## 2019-09-22 NOTE — Progress Notes (Signed)
Virtual Visit via Telephone Note   This visit type was conducted due to national recommendations for restrictions regarding the COVID-19 Pandemic (e.g. social distancing) in an effort to limit this patient's exposure and mitigate transmission in our community.  Due to her co-morbid illnesses, this patient is at least at moderate risk for complications without adequate follow up.  This format is felt to be most appropriate for this patient at this time.  The patient did not have access to video technology/had technical difficulties with video requiring transitioning to audio format only (telephone).  All issues noted in this document were discussed and addressed.  No physical exam could be performed with this format.  Please refer to the patient's chart for her  consent to telehealth for [facility].   Date:  09/22/2019   ID:  [name], DOB 06-21-1961, MRN [id number]  Patient Location: Home Provider Location: Home  PCP:  Elby Showers, MD  Cardiologist:  Dr Stanford Breed  Electrophysiologist:  None   Evaluation Performed:  Follow-Up Visit  Chief Complaint:  none  History of Present Illness:    Nancy Lawrence is a 58 y.o. female with a family history of coronary disease, apparently her brother died at 50 of an MI.  Other medical issues include hyperlipidemia, irritable bowel syndrome, depression, and DJD. She had a normal treadmill in August 2019.  Coronary CT calcium scoring was done August 2019.  Her calcium score was 11 which is the 79th percentile for age and sex matched control.  There was 1 isolated area of calcium noted in the proximal LAD.  Also noted on CT was a right upper lobe nodule of 1.6 cm.  Follow-up was recommended at 6 to 12 months.  The patient was contacted for this follow-up at 6 months but never had this done, the co-pay was too high.  Patient was contacted today for routine follow-up.  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 Herma Mering, and her work has been quite stressful in the [place] pandemic.  She is not exercising and has not been following her diet, "I am taking care of everyone else".  The patient does not have symptoms concerning for COVID-19 infection (fever, chills, cough, or new shortness of breath).    Past Medical History:  Diagnosis Date  . Allergy   . Anemia   . Fluid retention   . GERD (gastroesophageal reflux disease)   . History of snoring   . Hx of adenomatous colonic polyps 03/13/2016  . Hyperlipidemia   . Hypothyroid   . Vitamin D deficiency    Past Surgical History:  Procedure Laterality Date  . CHOLECYSTECTOMY    . COLONOSCOPY    . HERNIA REPAIR     umbilical     Current Meds  Medication Sig  . ALPRAZolam (XANAX) 0.5 MG tablet Take 1 tablet (0.5 mg total) by mouth 2 (two) times daily as needed for sleep.  . cetirizine (ZYRTEC) 10 MG tablet Take 10 mg by mouth daily.    . furosemide (LASIX) 40 MG tablet Take 1 tablet (40 mg total) by mouth daily as needed.  Marland Kitchen ibuprofen (ADVIL,MOTRIN) 200 MG tablet Take 200 mg by mouth every 6 (six) hours as needed.    Vanessa Kick Ethyl (VASCEPA) 1 g CAPS TAKE 2 CAPSULES (2 G TOTAL) BY MOUTH  2 (TWO) TIMES DAILY. TO REPLACE TRICOR  . levonorgestrel (MIRENA) 20 MCG/24HR IUD 1 each by Intrauterine route once.    Marland Kitchen levothyroxine (SYNTHROID) 50 MCG tablet Take 1 tablet (50 mcg total) by mouth daily.  . NEOMYCIN-POLYMYXIN-HYDROCORTISONE (CORTISPORIN) 1 % SOLN OTIC solution Place 3 drops into the left ear 4 (four) times daily.  Marland Kitchen omeprazole (PRILOSEC) 20 MG capsule TAKE 1 CAPSULE BY MOUTH EVERY DAY  . rosuvastatin (CRESTOR) 5 MG tablet Take 5 mg by mouth daily.  . valACYclovir (VALTREX) 1000 MG tablet Take 2 tabs by mouth at onset of symptoms and repeat dose once in 12 hours.     Allergies:   Penicillins, Statins, and Citalopram hydrobromide    Social History   Tobacco Use  . Smoking status: Former Smoker    Types: Cigarettes    Quit date: 03/05/1994    Years since quitting: 25.5  . Smokeless tobacco: Never Used  Substance Use Topics  . Alcohol use: Yes    Alcohol/week: 1.0 standard drinks    Types: 1 Cans of beer per week    Comment: socially  . Drug use: No     Family Hx: The patient's family history includes Diabetes in her father; Heart attack in her brother; Heart disease in her father; Hyperlipidemia in her father; Hypertension in her father.  ROS:   Please see the history of present illness.    All other systems reviewed and are negative.   Prior CV studies:   The following studies were reviewed today:  Coronary Ca++ scoring Aug 2019  Labs/Other Tests and Data Reviewed:    EKG:  No ECG reviewed.  Recent Labs: No results found for requested labs within last 8760 hours.   Recent Lipid Panel No results found for: CHOL, TRIG, HDL, CHOLHDL, LDLCALC, LDLDIRECT  Wt Readings from Last 3 Encounters:  09/22/19 218 lb (98.9 kg)  01/26/19 224 lb (101.6 kg)  12/17/18 222 lb (100.7 kg)     Objective:    Vital Signs:  Ht 5' 6.5" (1.689 m)   Wt 218 lb (98.9 kg)   BMI 34.66 kg/m    VITAL SIGNS:  reviewed  ASSESSMENT & PLAN:    Dyslipidemia She has been followed in our lipid clinic and has been tolerating Crestor 5 mg daily.  She stopped her cholesterol Rx recently for C-spine and back injections but will resume this.  Fm Hx of CAD Brother died in his 90's of an MI  RUL nodule This was an incidental finding on CT August 2019. I suggested she speak with her PCP about follow up, she is to see her later this week.   COVID-19 Education: The signs and symptoms of COVID-19 were discussed with the patient and how to seek care for testing (follow up with PCP or arrange E-visit).   The importance of social distancing was discussed today.  Time:   Today, I have spent  15 minutes with the patient with  telehealth technology discussing the above problems.     Medication Adjustments/Labs and Tests Ordered: Current medicines are reviewed at length with the patient today.  Concerns regarding medicines are outlined above.   Tests Ordered: No orders of the defined types were placed in this encounter.   Medication Changes: No orders of the defined types were placed in this encounter.   Follow Up:  Either In Person or Virtual lipid clinic f/u in 6 months- cardiology f/u PRN.   Angelena Form, PA-C  09/22/2019 11:58 AM  [facility]

## 2019-09-22 NOTE — Patient Instructions (Addendum)
Medication Instructions:  Your physician recommends that you continue on your current medications as directed. Please refer to the Current Medication list given to you today. *If you need a refill on your cardiac medications before your next appointment, please call your pharmacy*  Lab Work: None  If you have labs (blood work) drawn today and your tests are completely normal, you will receive your results only by: Marland Kitchen MyChart Message (if you have MyChart) OR . A paper copy in the mail If you have any lab test that is abnormal or we need to change your treatment, we will call you to review the results.  Testing/Procedures: None   Follow-Up: At [facility], you and your health needs are our priority.  As part of our continuing mission to provide you with exceptional heart care, we have created designated Provider Care Teams.  These Care Teams include your primary Cardiologist (physician) and Advanced Practice Providers (APPs -  Physician Assistants and Nurse Practitioners) who all work together to provide you with the care you need, when you need it.  Your next appointment:   6 months  The format for your next appointment:   In Person  Provider:   [place]  Your physician recommends that you schedule a follow-up appointment in: ON A AS NEEDED BASIS.  Other Instructions

## 2019-09-24 ENCOUNTER — Ambulatory Visit (INDEPENDENT_AMBULATORY_CARE_PROVIDER_SITE_OTHER): Payer: Federal, State, Local not specified - PPO | Admitting: Internal Medicine

## 2019-09-24 ENCOUNTER — Encounter: Payer: Self-pay | Admitting: Internal Medicine

## 2019-09-24 ENCOUNTER — Other Ambulatory Visit: Payer: Self-pay

## 2019-09-24 VITALS — BP 110/84 | HR 79 | Temp 98.3°F | Ht 66.5 in | Wt 226.0 lb

## 2019-09-24 DIAGNOSIS — R609 Edema, unspecified: Secondary | ICD-10-CM

## 2019-09-24 DIAGNOSIS — E039 Hypothyroidism, unspecified: Secondary | ICD-10-CM | POA: Diagnosis not present

## 2019-09-24 DIAGNOSIS — Z6835 Body mass index (BMI) 35.0-35.9, adult: Secondary | ICD-10-CM

## 2019-09-24 DIAGNOSIS — Z86010 Personal history of colonic polyps: Secondary | ICD-10-CM

## 2019-09-24 DIAGNOSIS — R911 Solitary pulmonary nodule: Secondary | ICD-10-CM

## 2019-09-24 DIAGNOSIS — Z0000 Encounter for general adult medical examination without abnormal findings: Secondary | ICD-10-CM | POA: Diagnosis not present

## 2019-09-24 DIAGNOSIS — E781 Pure hyperglyceridemia: Secondary | ICD-10-CM | POA: Diagnosis not present

## 2019-09-24 DIAGNOSIS — F411 Generalized anxiety disorder: Secondary | ICD-10-CM

## 2019-09-24 DIAGNOSIS — N6012 Diffuse cystic mastopathy of left breast: Secondary | ICD-10-CM

## 2019-09-24 DIAGNOSIS — F439 Reaction to severe stress, unspecified: Secondary | ICD-10-CM

## 2019-09-24 LAB — POCT URINALYSIS DIPSTICK
Appearance: NEGATIVE
Bilirubin, UA: NEGATIVE
Blood, UA: NEGATIVE
Glucose, UA: NEGATIVE
Ketones, UA: NEGATIVE
Leukocytes, UA: NEGATIVE
Nitrite, UA: NEGATIVE
Odor: NEGATIVE
Protein, UA: NEGATIVE
Spec Grav, UA: 1.01 (ref 1.010–1.025)
Urobilinogen, UA: 0.2 E.U./dL
pH, UA: 6.5 (ref 5.0–8.0)

## 2019-09-24 MED ORDER — ALPRAZOLAM 0.5 MG PO TABS: 0.5000 mg | ORAL_TABLET | Freq: Two times a day (BID) | ORAL | 1 refills | Status: DC | PRN

## 2019-09-24 MED ORDER — PHENTERMINE HCL 30 MG PO CAPS: 30.0000 mg | ORAL_CAPSULE | ORAL | 0 refills | Status: DC

## 2019-09-24 NOTE — Patient Instructions (Addendum)
Pt to have CT of lung to monitor nodule. Pt to ask GYN about removing IUD. Refilled Xanax. Small Rx for phentermine to jump start weight loss. Restart Crestor. Follow up 6 months.

## 2019-10-05 ENCOUNTER — Telehealth: Payer: Self-pay | Admitting: Internal Medicine

## 2019-10-05 ENCOUNTER — Other Ambulatory Visit: Payer: Self-pay | Admitting: Internal Medicine

## 2019-10-05 ENCOUNTER — Ambulatory Visit
Admission: RE | Admit: 2019-10-05 | Discharge: 2019-10-05 | Disposition: A | Discharge status: Home / Self Care | Payer: Federal, State, Local not specified - PPO | Source: Ambulatory Visit | Attending: Internal Medicine | Admitting: Internal Medicine

## 2019-10-05 DIAGNOSIS — R9389 Abnormal findings on diagnostic imaging of other specified body structures: Secondary | ICD-10-CM

## 2019-10-05 DIAGNOSIS — R918 Other nonspecific abnormal finding of lung field: Secondary | ICD-10-CM | POA: Diagnosis not present

## 2019-10-05 DIAGNOSIS — R911 Solitary pulmonary nodule: Secondary | ICD-10-CM

## 2019-10-05 NOTE — Telephone Encounter (Signed)
Spoke with patient regarding results of CT chest. Problem was initially detected  when she was seen because of heart issue in her brother who died suddenly.Ground glass opacity RUQ has resolved but new area RLL noted perhaps atelectasis. Referring to Pulmonary for evaluation. Pt understands this.

## 2019-10-11 NOTE — Progress Notes (Signed)
Subjective:    Patient ID: Nancy Lawrence, female    DOB: 1961-07-09, 58 y.o.   MRN: [id number]  HPI 58 year old Female in today for health maintenance exam and evaluation of medical issues.  She saw cardiologist last year because her brother had sudden cardiac death.  She has a CT cardiac score but on that study a ground glass attenuating nodule was noted in the right upper lobe measuring 1.6 cm.  Follow-up was recommended in 6 to 12 months.  Patient was asymptomatic with regard to lung issues.  A chest CT  now needs to be ordered.  Weight continues to be an issue for her.  I recommended Dr. Migdalia Dk practice.  She prefers just to get prescription for phentermine.  Given phentermine for 30 days.  She is allergic to penicillins and had possible adverse reactions to Crestor and Celexa  Is menopausal and has had issues with hot flashes.  History of scalp dermatitis treated by Dr. Ubaldo Glassing.  History of sleep apnea  History of vitamin D deficiency dependent edema hyperlipidemia anxiety GE reflux and vertigo.  History of IUD in place and probably needs to be removed.  She will check with GYN physician.  Social history: Married.  2 daughters.  Husband is a Therapist, sports carrier.  She completed 2 years of college.  She works in Mudlogger at [employer].  Does not smoke.  Social alcohol consumption.  Family history: Brother died of an acute MI.  Father with history of diabetes, heart disease hyperlipidemia MI, hypertension.  Paternal uncle with MI.  2 brothers with hypertension, diabetes mellitus, hyperlipidemia.  No sisters.  Has tried Scientist, clinical (histocompatibility and immunogenetics) for hypertriglyceridemia.  Has taken omeprazole for GE reflux and Lasix for dependent edema.  Takes Xanax for anxiety.  Cardiologist has her on Vascepa and Crestor.  Is on low-dose thyroid replacement for elevated TSH and TSH is now stable.    Review of Systems fatigue, situational stress, worried about lung nodule     Objective:   Physical Exam Vitals signs reviewed.   Skin warm and dry.  Nodes none.  Has scalp dermatitis unchanged.  TMs are clear.  Neck is supple without JVD thyromegaly or carotid bruits.  Chest clear to auscultation.  No rales or wheezing noted.  Cardiac exam regular rate and rhythm normal S1 and S2.  Abdomen obese soft nondistended without hepatosplenomegaly masses or tenderness.  Extremities without pitting edema but has trace lower extremity edema.  Neuro no focal deficits on brief neurological exam.  Affect is stressed.  Situational stress with family issues discussed.        Assessment & Plan:  Opacity and right upper lobe on cardiac CT last year-chest CT to be  to be ordered  Addendum: Chest CT October 05, 2019 shows ground glass opacity right upper lobe previously seen is no longer visualized but there is an interval development of ill-defined opacity seen posteriorly in the right lower lobe consistent with dependent subsegmental atelectasis.-Because of these abnormalities she is being referred to pulmonologist.  History of vitamin D deficiency  Hyperlipidemia-cardiologist has given her Vascepa and Crestor.  LDL recently elevated at 128.  Anxiety treated with Xanax  GE reflux treated with PPI  History of dependent edema treated with Lasix  History of scalp dermatitis treated by Dr. Ubaldo Glassing  Obesity -given small quantity of phentermine.  I recommended Dr. Migdalia Dk clinic  Recent fasting glucose 118 but hemoglobin A1c was normal  Mild hypothyroidism treated with Synthroid 0.05 mg daily with  normal TSH  Situational stress with family  History of sleep apnea  History of benign positional vertigo but no recent recurrence  Plan: Given new finding on chest CT-we will be referring to pulmonary.  Initial groundglass opacity seems to have resolved.  Follow-up here in 6 months.  Consider counseling for situational stress.

## 2019-10-14 ENCOUNTER — Ambulatory Visit (INDEPENDENT_AMBULATORY_CARE_PROVIDER_SITE_OTHER): Payer: Federal, State, Local not specified - PPO | Admitting: Internal Medicine

## 2019-10-14 ENCOUNTER — Encounter: Payer: Self-pay | Admitting: Internal Medicine

## 2019-10-14 ENCOUNTER — Other Ambulatory Visit: Payer: Self-pay

## 2019-10-14 ENCOUNTER — Telehealth: Payer: Self-pay | Admitting: Internal Medicine

## 2019-10-14 DIAGNOSIS — Z20822 Contact with and (suspected) exposure to covid-19: Secondary | ICD-10-CM

## 2019-10-14 DIAGNOSIS — Z20828 Contact with and (suspected) exposure to other viral communicable diseases: Secondary | ICD-10-CM | POA: Diagnosis not present

## 2019-10-14 NOTE — Telephone Encounter (Addendum)
Glynna Mohamud [phone]  Danajha called to say that she was exposed yesterday to an employee that tested positive for COVID and the flu. The employee went somewhere and got rapid test. She called Dorita Fray because the line at [facility] is so long and their is 2-3 hour wait. Whom ever she talked to said she could get a rapid test if they had an order from PCP. 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 I did let her know that today would be too soon to be tested since she was just exposed yesterday.

## 2019-10-14 NOTE — Telephone Encounter (Signed)
Scheduled

## 2019-10-14 NOTE — Progress Notes (Signed)
   Subjective:    Patient ID: Nancy Lawrence, female    DOB: 1961/06/11, 58 y.o.   MRN: [id number]  HPI  58 year old Female seen today by interactive audio and video telecommunications due to the coronavirus pandemic.  She is identified using 2 identifiers as Nancy Lawrence. [name], a patient in this practice.  She is agreeable to visit in this format today.  Patient was apparently exposed to an individual with COVID-19 at the workplace.  This is frightening for her.  She is alarmed.  Apparently, her mother is to have some upcoming surgery and she does not want to expose her mother.  Currently patient is asymptomatic but very anxious.   Review of Systems see above     Objective:   Physical Exam Reports that she has no fever, chills, dysgeusia, nausea, vomiting, or myalgias       Assessment & Plan:  Close exposure to COVID-19  Plan: She currently is asymptomatic and I believe her risk is low given the situation but she is welcome to go to [facility] for test.

## 2019-10-14 NOTE — Telephone Encounter (Signed)
Set up virtual visit now

## 2019-10-18 ENCOUNTER — Encounter: Payer: Self-pay | Admitting: Pulmonary Disease

## 2019-10-18 ENCOUNTER — Telehealth: Payer: Self-pay | Admitting: Pulmonary Disease

## 2019-10-18 ENCOUNTER — Other Ambulatory Visit: Payer: Self-pay

## 2019-10-18 ENCOUNTER — Ambulatory Visit: Payer: Federal, State, Local not specified - PPO | Admitting: Pulmonary Disease

## 2019-10-18 ENCOUNTER — Institutional Professional Consult (permissible substitution): Payer: Federal, State, Local not specified - PPO | Admitting: Pulmonary Disease

## 2019-10-18 VITALS — BP 160/90 | HR 87 | Temp 97.6°F | Ht 66.25 in | Wt 230.0 lb

## 2019-10-18 DIAGNOSIS — R9389 Abnormal findings on diagnostic imaging of other specified body structures: Secondary | ICD-10-CM | POA: Diagnosis not present

## 2019-10-18 DIAGNOSIS — E669 Obesity, unspecified: Secondary | ICD-10-CM

## 2019-10-18 DIAGNOSIS — G4733 Obstructive sleep apnea (adult) (pediatric): Secondary | ICD-10-CM

## 2019-10-18 NOTE — Progress Notes (Signed)
Subjective:   PATIENT ID: Nancy Lawrence GENDER: female DOB: [birth year], MRN: [id number]   HPI  Chief Complaint  Patient presents with  . Consult    CT scan showed nodule x1year right nodule recent CT on left lung    Reason for Visit: New consult for abnormal CT Chest  Ms. Nancy Lawrence is 58 year old female with a deviated septum is an allergic rhinitis, OSA not on CPAP and reflux who presents for consult regarding abnormal Chest CT.  She was incidentally found with RUL GGO during a CT cardiac scoring image obtained on 06/17/18 due to significant cardiac history in her family. She was advised to have follow-up imaging completed however due to the pandemic this was delayed until last month. A dedicated CT Chest was completed on 10/05/19 which demonstrated resolution of the RUL opacity but radiology report commented on right lower lobe atelectasis. Her PCP referred her to Pulmonary for further evaluation.  Overall, she reports good respiratory health. Denies shortness of breath, wheezing, cough, chest pain. Does report nasal congestion which she manages with over-the-counter allergy medications and Vicks nightly. Denies childhood or adult issues with recurrent respiratory infections. She has a prior diagnosis of mild OSA however did not opt to wear a CPAP. She admits to poor quality of sleep but attributes this to social factors. Denies excessive daytime sleepiness, headaches, fatigue or difficulty concentrating. Denies fevers, chills, unintentional weight loss or night sweats.  Social History: Brother passed from [place] for her parents  I have personally reviewed patient's past medical/family/social history, allergies, current medications.  Past Medical History:  Diagnosis Date  . Allergy   . Anemia   . Fluid retention   . GERD (gastroesophageal reflux disease)   . History of snoring   . Hx of adenomatous colonic polyps 03/13/2016  . Hyperlipidemia   . Hypothyroid    . Pneumonia   . Pulmonary nodule   . Sleep apnea   . Vitamin D deficiency      Family History  Problem Relation Age of Onset  . Ovarian cancer Mother   . Heart disease Father   . Hyperlipidemia Father   . Hypertension Father   . Diabetes Father   . Heart attack Brother   . Diabetes Brother   . Hypertension Brother   . Hyperlipidemia Brother   . Heart disease Brother      Social History   Occupational History  . Not on file  Tobacco Use  . Smoking status: Former Smoker    Packs/day: 0.50    Years: 12.00    Pack years: 6.00    Types: Cigarettes    Quit date: 03/05/1994    Years since quitting: 25.6  . Smokeless tobacco: Never Used  Substance and Sexual Activity  . Alcohol use: Yes    Alcohol/week: 1.0 standard drinks    Types: 1 Cans of beer per week    Comment: socially  . Drug use: No  . Sexual activity: Not on file    Allergies  Allergen Reactions  . Penicillins Rash    Per pt,can take certain penicillins!  . Statins     Cause muscle pain, edema,changes in blood work!  . Citalopram Hydrobromide      Outpatient Medications Prior to Visit  Medication Sig Dispense Refill  . ALPRAZolam (XANAX) 0.5 MG tablet Take 1 tablet (0.5 mg total) by mouth 2 (two) times daily as needed for sleep. 60 tablet 1  . cetirizine (ZYRTEC) 10 MG  tablet Take 10 mg by mouth daily.      . furosemide (LASIX) 40 MG tablet Take 1 tablet (40 mg total) by mouth daily as needed. 90 tablet 1  . ibuprofen (ADVIL,MOTRIN) 200 MG tablet Take 200 mg by mouth every 6 (six) hours as needed.      Vanessa Kick Ethyl (VASCEPA) 1 g CAPS TAKE 2 CAPSULES (2 G TOTAL) BY MOUTH 2 (TWO) TIMES DAILY. TO REPLACE TRICOR 120 capsule 3  . levonorgestrel (MIRENA) 20 MCG/24HR IUD 1 each by Intrauterine route once.      Marland Kitchen levothyroxine (SYNTHROID) 50 MCG tablet Take 1 tablet (50 mcg total) by mouth daily. 90 tablet 0  . NEOMYCIN-POLYMYXIN-HYDROCORTISONE (CORTISPORIN) 1 % SOLN OTIC solution Place 3 drops into the  left ear 4 (four) times daily. 10 mL 0  . omeprazole (PRILOSEC) 20 MG capsule TAKE 1 CAPSULE BY MOUTH EVERY DAY 90 capsule 3  . rosuvastatin (CRESTOR) 5 MG tablet Take 5 mg by mouth daily.    . valACYclovir (VALTREX) 1000 MG tablet Take 2 tabs by mouth at onset of symptoms and repeat dose once in 12 hours. 28 tablet 0  . phentermine 30 MG capsule Take 1 capsule (30 mg total) by mouth every morning. (Patient not taking: Reported on 10/18/2019) 30 capsule 0   No facility-administered medications prior to visit.     Review of Systems  Constitutional: Negative for chills, diaphoresis, fever, malaise/fatigue and weight loss.  HENT: Positive for congestion. Negative for ear pain and sore throat.   Respiratory: Negative for cough, hemoptysis, sputum production, shortness of breath and wheezing.   Cardiovascular: Negative for chest pain, palpitations and leg swelling.  Gastrointestinal: Negative for abdominal pain, heartburn and nausea.  Genitourinary: Negative for frequency.  Musculoskeletal: Negative for joint pain and myalgias.  Skin: Negative for itching and rash.  Neurological: Negative for dizziness, weakness and headaches.  Endo/Heme/Allergies: Does not bruise/bleed easily.  Psychiatric/Behavioral: Negative for depression. The patient is not nervous/anxious.     Objective:   Vitals:   10/18/19 1042  BP: (!) 160/90  Pulse: 87  Temp: 97.6 F (36.4 C)  TempSrc: Temporal  SpO2: 97%  Weight: 230 lb (104.3 kg)  Height: 5' 6.25" (1.683 m)   SpO2: 97 % O2 Device: None (Room air)  Physical Exam: General: Well-appearing, no acute distress HENT: [place], AT Eyes: EOMI, no scleral icterus Respiratory: Clear to auscultation bilaterally.  No crackles, wheezing or rales Cardiovascular: RRR, -M/R/G, no JVD GI: BS+, soft, nontender Extremities:-Edema,-tenderness Neuro: AAO x4, CNII-XII grossly intact Skin: Intact, no rashes or bruising Psych: Normal mood, normal affect  Data Reviewed:   Imaging: CT Cardiac Scoring 06/17/18 - RUL groundglass opacity. Coronary calcium score: 11 CT Chest 10/05/19 - Resolved RUL opacity. RLL atelectasis, dependent.  PFT: None on file  Labs: CBC    Component Value Date/Time   WBC 10.8 (A) 05/24/2012 1507   WBC 5.2 04/20/2007 1304   RBC 5.05 05/24/2012 1507   RBC 5.01 04/20/2007 1304   HGB 14.6 05/24/2012 1507   HGB 13.5 04/20/2007 1304   HCT 46.6 05/24/2012 1507   HCT 38.6 04/20/2007 1304   PLT 281 04/20/2007 1304   MCV 92.3 05/24/2012 1507   MCV 77.1 (L) 04/20/2007 1304   MCH 28.9 05/24/2012 1507   MCH 26.9 04/20/2007 1304   MCHC 31.3 (A) 05/24/2012 1507   MCHC 35.0 04/20/2007 1304   RDW 19.8 (H) 04/20/2007 1304   LYMPHSABS 1.4 04/20/2007 1304   MONOABS 0.4 04/20/2007 1304  EOSABS 0.1 04/20/2007 1304   BASOSABS 0.0 04/20/2007 1304   BMET    Component Value Date/Time   NA 143 05/26/2012 1220   K 4.1 05/26/2012 1220   CL 108 05/26/2012 1220   CO2 27 05/26/2012 1220   GLUCOSE 122 (H) 05/26/2012 1220   BUN 16 05/26/2012 1220   CREATININE 1.00 05/26/2012 1220   CALCIUM 10.3 05/26/2012 1220   Imaging, labs and tests noted above have been reviewed independently by me.    Assessment & Plan:   Discussion: 58 year old female with mild OSA, not on CPAP, who presents as a consult for abnormal CT Chest. We reviewed CT Cardiac and dedicated CT Chest in clinic. RUL opacity from initial imaging resolved without intervention and likely represented inflammatory or infectious process. CT from 09/2019 reviewed with patient. She has very minimal bronchial widening/thickening in the RLL associated with atelectasis. This could represent a sequelae of prior respiratory infection. Patient denies any aspiration or dysphagia. Atelectasis is present but does not warrant further work-up in asymptomatic patient.  She does report poor sleep quality and with her prior diagnosis of OSA and probable weight gain in the last few years, I offered repeat  home sleep study to evaluate ongoing presence or worsening of OSA. She declined testing at this time with plan to work on weight loss with her PCP.  Right lower lobe atelectasis We reviewed imaging from your Cardiac scoring and recent dedicated CT Chest. --No further imaging or additional testing indicated  Poor sleep hygiene, snoring Mild OSA, not on CPAP Obesity (BMI >35) --Hold off on home sleep study based on patient preference. Please let us know if you are interested in the future --Discussed weight loss. She is planning to start Phentermine with her PCP  Follow-up with me as needed  Health Maintenance Immunization History  Administered Date(s) Administered  . Influenza Whole 08/11/2008, 08/23/2019  . Tdap 11/11/2006   CT Lung Screen - not qualified  No orders of the defined types were placed in this encounter. No orders of the defined types were placed in this encounter.   Return if symptoms worsen or fail to improve.  [place], MD [facility] 10/18/2019 10:56 AM  Office Number [phone]

## 2019-10-18 NOTE — Patient Instructions (Addendum)
Right lower lobe atelectasis We reviewed imaging from your Cardiac scoring and recent dedicated CT Chest. --No further imaging or additional testing indicated  Poor sleep hygiene, snoring Obesity (BMI >35) --Hold off on home sleep study based on patient preference. Please let us know if you are interested in the future --Discussed weight loss. She is planning to start Phentermine with her PCP  Follow-up with me as needed

## 2019-10-18 NOTE — Telephone Encounter (Signed)
Spoke with pt  She states her covid test was neg and she has faxed a copy and will also bring a copy of the negative result to her appt today  Nothing further needed

## 2019-10-30 ENCOUNTER — Other Ambulatory Visit: Payer: Self-pay | Admitting: Internal Medicine

## 2019-11-01 ENCOUNTER — Other Ambulatory Visit: Payer: Self-pay | Admitting: *Deleted

## 2019-11-01 MED ORDER — ROSUVASTATIN CALCIUM 5 MG PO TABS: 5.0000 mg | ORAL_TABLET | Freq: Every day | ORAL | 3 refills | Status: DC

## 2019-11-06 NOTE — Patient Instructions (Signed)
I feel her risk of Covid 66 is low given circumstances but she is welcome to go to Test center for testing.

## 2019-12-18 DIAGNOSIS — Z20828 Contact with and (suspected) exposure to other viral communicable diseases: Secondary | ICD-10-CM | POA: Diagnosis not present

## 2019-12-18 DIAGNOSIS — Z03818 Encounter for observation for suspected exposure to other biological agents ruled out: Secondary | ICD-10-CM | POA: Diagnosis not present

## 2020-01-25 ENCOUNTER — Other Ambulatory Visit: Payer: Self-pay | Admitting: Cardiology

## 2020-01-30 ENCOUNTER — Other Ambulatory Visit: Payer: Self-pay | Admitting: Internal Medicine

## 2020-03-20 DIAGNOSIS — Z1329 Encounter for screening for other suspected endocrine disorder: Secondary | ICD-10-CM | POA: Diagnosis not present

## 2020-03-20 DIAGNOSIS — Z131 Encounter for screening for diabetes mellitus: Secondary | ICD-10-CM | POA: Diagnosis not present

## 2020-03-20 DIAGNOSIS — Z13220 Encounter for screening for lipoid disorders: Secondary | ICD-10-CM | POA: Diagnosis not present

## 2020-03-20 DIAGNOSIS — Z0000 Encounter for general adult medical examination without abnormal findings: Secondary | ICD-10-CM | POA: Diagnosis not present

## 2020-03-20 DIAGNOSIS — Z130 Encounter for screening for diseases of the blood and blood-forming organs and certain disorders involving the immune mechanism: Secondary | ICD-10-CM | POA: Diagnosis not present

## 2020-03-23 ENCOUNTER — Ambulatory Visit: Payer: Federal, State, Local not specified - PPO | Admitting: Internal Medicine

## 2020-03-23 ENCOUNTER — Other Ambulatory Visit: Payer: Self-pay

## 2020-03-23 ENCOUNTER — Encounter: Payer: Self-pay | Admitting: Internal Medicine

## 2020-03-23 VITALS — BP 102/80 | HR 68 | Temp 98.0°F | Ht 66.25 in | Wt 208.0 lb

## 2020-03-23 DIAGNOSIS — M19049 Primary osteoarthritis, unspecified hand: Secondary | ICD-10-CM | POA: Diagnosis not present

## 2020-03-23 DIAGNOSIS — Z8659 Personal history of other mental and behavioral disorders: Secondary | ICD-10-CM

## 2020-03-23 DIAGNOSIS — R609 Edema, unspecified: Secondary | ICD-10-CM | POA: Diagnosis not present

## 2020-03-23 DIAGNOSIS — Z6833 Body mass index (BMI) 33.0-33.9, adult: Secondary | ICD-10-CM

## 2020-03-23 DIAGNOSIS — E039 Hypothyroidism, unspecified: Secondary | ICD-10-CM

## 2020-03-23 DIAGNOSIS — K219 Gastro-esophageal reflux disease without esophagitis: Secondary | ICD-10-CM

## 2020-03-23 DIAGNOSIS — E781 Pure hyperglyceridemia: Secondary | ICD-10-CM | POA: Diagnosis not present

## 2020-03-23 DIAGNOSIS — G4733 Obstructive sleep apnea (adult) (pediatric): Secondary | ICD-10-CM

## 2020-03-23 DIAGNOSIS — L219 Seborrheic dermatitis, unspecified: Secondary | ICD-10-CM

## 2020-03-23 MED ORDER — LEVOTHYROXINE SODIUM 75 MCG PO TABS: 75.0000 ug | ORAL_TABLET | Freq: Every day | ORAL | 0 refills | Status: DC

## 2020-03-23 NOTE — Progress Notes (Signed)
   Subjective:    Patient ID: Nancy Lawrence, female    DOB: [birth year], 60 y.o.   MRN: [id number]  HPI 59 year old for 6 month recheck.  She saw her pulmonologist in December regarding opacity in right upper lobe initially seen on  cardiac CT.  Right upper lobe opacity from initial imaging on cardiac CTA resolved without intervention and likely represented inflammatory or infectious process.  CT from November 2020 reviewed and showed very minimal bronchial widening/thickening in the right lower lobe associated with likely subsegmental atelectasis.  History of sleep apnea.  Weight gain noted.  Home sleep study recommended by pulmonologist but patient declined.  History of hyperlipidemia and has tried Scientist, clinical (histocompatibility and immunogenetics) in the past.  History of GE reflux treated with PPI.  Takes Lasix for dependent edema.  Take Xanax for anxiety.  Cardiologist has her on Vascepa and Crestor.  Is on low-dose thyroid replacement for elevated TSH.  History of vitamin D deficiency.  Scalp dermatitis treated by Dr. Ubaldo Glassing.  She brings in labs done through her office at [employer] where she is employed.  Triglycerides are 208 and LDL cholesterol was 111 with total cholesterol of 180.  Fasting glucose is elevated at 104.  BUN and creatinine are normal.  Liver functions are normal.  Hemoglobin A1c normal at 5.5%.  TSH is elevated at 4.18.  CBC is normal with the exception of a slightly low MCV at 79.1.  Hemoglobin is normal at 13.2 g  Levothyroxine increased to 0.075 mg daily from 0.05 mg daily.  Triglycerides remain elevated.  Continue to work on diet and exercise.  Review of Systems New complaint is both hands: joints hurting. Takes Ibuprofen 600 mg daily. Draw Rheumatology studies.     Objective:   Physical Exam Vital signs reviewed.  Blood pressure excellent 102/80 weight is 208 pounds.  BMI 33.32.  She has has lost from 226 pounds in November to 208 pounds currently which is excellent.  She will  continue with diet and exercise efforts.  Do not see significantly tender red or painful hand joints on exam.  Rheumatology studies obtained.  No thyromegaly.  Chest clear.  Cardiac exam regular rate and rhythm.        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  Rheumatology studies pending.  Can treat with NSAIDs.  Elevated BMI improved with weight loss.  Continue diet and exercise efforts  Hypertriglyceridemia-triglycerides 208-continue current regimen of Vascepa and Crestor by cardiology and continue diet and exercise efforts  Anxiety treated with Xanax  History of vitamin D deficiency  Dependent edema treated with Lasix  GE reflux treated with PPI  Mild hypothyroidism treated with Synthroid  History of sleep apnea and station improved with continuing weight loss  History of vitamin D deficiency  Scalp dermatitis treated by Dr. Ubaldo Glassing  Plan: Continue current regimen and follow-up in 6 months for health maintenance exam and evaluation of medical issues.  Rheumatology studies pending.  Addendum: Rheumatology studies are normal.  Recommend NSAIDs for hand pain.  Likely has osteoarthritis.  Can see a rheumatologist if she so desires.

## 2020-03-24 LAB — RHEUMATOID FACTOR: Rheumatoid fact SerPl-aCnc: <14 IU/mL (ref <14)

## 2020-03-24 LAB — CYCLIC CITRUL PEPTIDE ANTIBODY, IGG: Cyclic Citrullin Peptide Ab: <16 UNITS

## 2020-03-24 LAB — ANA: Anti Nuclear Antibody (ANA): NEGATIVE

## 2020-03-24 LAB — SEDIMENTATION RATE: Sed Rate: 2 mm/h (ref 0–30)

## 2020-04-09 NOTE — Patient Instructions (Signed)
Congratulations on weight loss efforts.  Keep up the good work.  Continue current medications and come back in 6 months for health maintenance exam and evaluation of medical issues.  Rheumatology studies are within normal limits.  May take NSAIDs for hand arthritis which is likely osteoarthritis.

## 2020-05-08 DIAGNOSIS — R7989 Other specified abnormal findings of blood chemistry: Secondary | ICD-10-CM | POA: Diagnosis not present

## 2020-05-08 DIAGNOSIS — Z1329 Encounter for screening for other suspected endocrine disorder: Secondary | ICD-10-CM | POA: Diagnosis not present

## 2020-05-09 ENCOUNTER — Encounter: Payer: Self-pay | Admitting: Internal Medicine

## 2020-05-09 ENCOUNTER — Other Ambulatory Visit: Payer: Self-pay

## 2020-05-09 ENCOUNTER — Ambulatory Visit: Payer: Federal, State, Local not specified - PPO | Admitting: Internal Medicine

## 2020-05-09 VITALS — BP 110/80 | HR 74 | Ht 66.25 in | Wt 200.0 lb

## 2020-05-09 DIAGNOSIS — E039 Hypothyroidism, unspecified: Secondary | ICD-10-CM | POA: Diagnosis not present

## 2020-05-09 NOTE — Progress Notes (Signed)
° °  Subjective:    Patient ID: Nancy Lawrence, female    DOB: Apr 29, 1961, 59 y.o.   MRN: [id number]  HPI 59 year old Female seen in mid May for 6 month recheck. At the time, TSH was elevated on Levothyroxine 50 mcg daily at 4.18. She was having issues with hand arthritis and had Rheumatology labs including CCP, ANA, sed rate and rheumatoid factor all of which were normal. Levothyroxine was increased to 75 mcg daily. She has had TSH checked recently through [facility] and it is now WNL at 0.92. We will continue current dose of Levothyroxine.    Review of Systems     Objective:   Physical Exam Patient is afebrile.  Blood pressure 110/80 pulse 74 pulse oximetry 97% weight 200 pounds.  BMI 32.04  Has lost 8 pounds since May which is excellent.     Assessment & Plan:  Hypothyroidism-TSH improved on increased dose of levothyroxine.  Continue levothyroxine 75 mcg daily.  Return in November for complete physical exam.  She will have fasting labs drawn through [facility].

## 2020-05-09 NOTE — Patient Instructions (Signed)
It was a pleasure to see you today.  Continue levothyroxine 75 mcg daily and follow-up with physical exam in November.  We understand you will be bringing fasting labs from [facility] with you to that appointment.  Congratulations on 8 pound weight loss.

## 2020-05-15 ENCOUNTER — Other Ambulatory Visit: Payer: Self-pay | Admitting: Internal Medicine

## 2020-06-16 ENCOUNTER — Other Ambulatory Visit: Payer: Self-pay | Admitting: Internal Medicine

## 2020-06-29 ENCOUNTER — Other Ambulatory Visit: Payer: Self-pay | Admitting: Cardiology

## 2020-09-08 ENCOUNTER — Other Ambulatory Visit: Payer: Self-pay | Admitting: Internal Medicine

## 2020-09-25 DIAGNOSIS — Z0000 Encounter for general adult medical examination without abnormal findings: Secondary | ICD-10-CM | POA: Diagnosis not present

## 2020-09-25 DIAGNOSIS — Z13220 Encounter for screening for lipoid disorders: Secondary | ICD-10-CM | POA: Diagnosis not present

## 2020-09-25 DIAGNOSIS — Z833 Family history of diabetes mellitus: Secondary | ICD-10-CM | POA: Diagnosis not present

## 2020-09-25 DIAGNOSIS — Z1329 Encounter for screening for other suspected endocrine disorder: Secondary | ICD-10-CM | POA: Diagnosis not present

## 2020-09-28 ENCOUNTER — Ambulatory Visit (INDEPENDENT_AMBULATORY_CARE_PROVIDER_SITE_OTHER): Payer: Federal, State, Local not specified - PPO | Admitting: Internal Medicine

## 2020-09-28 ENCOUNTER — Other Ambulatory Visit: Payer: Self-pay

## 2020-09-28 ENCOUNTER — Other Ambulatory Visit (HOSPITAL_COMMUNITY)
Admission: RE | Admit: 2020-09-28 | Discharge: 2020-09-28 | Disposition: A | Discharge status: Home / Self Care | Payer: Federal, State, Local not specified - PPO | Source: Ambulatory Visit | Attending: Internal Medicine | Admitting: Internal Medicine

## 2020-09-28 ENCOUNTER — Encounter: Payer: Self-pay | Admitting: Internal Medicine

## 2020-09-28 VITALS — BP 110/80 | HR 80 | Ht 66.25 in | Wt 210.0 lb

## 2020-09-28 DIAGNOSIS — Z6833 Body mass index (BMI) 33.0-33.9, adult: Secondary | ICD-10-CM

## 2020-09-28 DIAGNOSIS — Z8659 Personal history of other mental and behavioral disorders: Secondary | ICD-10-CM

## 2020-09-28 DIAGNOSIS — Z86010 Personal history of colonic polyps: Secondary | ICD-10-CM

## 2020-09-28 DIAGNOSIS — L219 Seborrheic dermatitis, unspecified: Secondary | ICD-10-CM

## 2020-09-28 DIAGNOSIS — K219 Gastro-esophageal reflux disease without esophagitis: Secondary | ICD-10-CM

## 2020-09-28 DIAGNOSIS — Z8639 Personal history of other endocrine, nutritional and metabolic disease: Secondary | ICD-10-CM

## 2020-09-28 DIAGNOSIS — R609 Edema, unspecified: Secondary | ICD-10-CM

## 2020-09-28 DIAGNOSIS — E039 Hypothyroidism, unspecified: Secondary | ICD-10-CM

## 2020-09-28 DIAGNOSIS — Z0000 Encounter for general adult medical examination without abnormal findings: Secondary | ICD-10-CM

## 2020-09-28 DIAGNOSIS — Z124 Encounter for screening for malignant neoplasm of cervix: Secondary | ICD-10-CM | POA: Diagnosis not present

## 2020-09-28 DIAGNOSIS — Z8719 Personal history of other diseases of the digestive system: Secondary | ICD-10-CM

## 2020-09-28 DIAGNOSIS — E782 Mixed hyperlipidemia: Secondary | ICD-10-CM

## 2020-09-28 LAB — POCT URINALYSIS DIPSTICK
Appearance: NEGATIVE
Bilirubin, UA: NEGATIVE
Blood, UA: NEGATIVE
Glucose, UA: NEGATIVE
Ketones, UA: NEGATIVE
Leukocytes, UA: NEGATIVE
Nitrite, UA: NEGATIVE
Odor: NEGATIVE
Protein, UA: NEGATIVE
Spec Grav, UA: 1.02 (ref 1.010–1.025)
Urobilinogen, UA: 0.2 E.U./dL
pH, UA: 5 (ref 5.0–8.0)

## 2020-09-28 MED ORDER — ALPRAZOLAM 0.5 MG PO TABS: 0.5000 mg | ORAL_TABLET | Freq: Two times a day (BID) | ORAL | 1 refills | Status: DC | PRN

## 2020-09-28 MED ORDER — IBUPROFEN 600 MG PO TABS: 600.0000 mg | ORAL_TABLET | Freq: Two times a day (BID) | ORAL | 1 refills | Status: DC | PRN

## 2020-09-28 MED ORDER — PHENTERMINE HCL 30 MG PO CAPS: 30.0000 mg | ORAL_CAPSULE | ORAL | 0 refills | Status: DC

## 2020-09-28 MED ORDER — FUROSEMIDE 40 MG PO TABS: 40.0000 mg | ORAL_TABLET | Freq: Every day | ORAL | 1 refills | Status: DC | PRN

## 2020-09-28 NOTE — Progress Notes (Signed)
Subjective:    Patient ID: Nancy Lawrence, female    DOB: 1961/04/03, 59 y.o.   MRN: [id number]  HPI  59 year old Female for health maintenance exam and evaluation of medical issues.  Continued situational stress with parents. Some job stress.  She saw Cardiologist in 2019 because her brother had sudden cardiac death.  She had a CT cardiac calcium scoring and score was 11.  On the same study, a ground glass opacity was seen in the upper lobe of right lung.    Study was repeated in 2020 and opacity was no longer seen.  She was seen by Pulmonologist in December 2020.  No further imaging was indicated.  Colonoscopy was due 2020.  She was reminded today.  History of mild sleep apnea.  History of scalp dermatitis treated by Dr. Ubaldo Glassing.  She is menopausal and has had issues with hot flashes.  History of vitamin D deficiency, dependent edema, hyperlipidemia, anxiety, GE reflux and vertigo.  History of IUD in place managed by GYN physician.  Is transitioning into Tricor for hypertriglyceridemia.  Has taken omeprazole for GE reflux and Lasix for dependent edema.  Takes Xanax for anxiety.  Cardiologist has her on Vascepa and Crestor.  Is on low-dose thyroid replacement for elevated TSH.  Social history: Married.  2 daughters.  Husband is a Therapist, sports carrier.  She completed 2 years of college.  Works in [employer] with [employer].  She does not smoke.  Social alcohol consumption.  Family history: Brother died of an acute MI.  Father with history of diabetes, heart disease, hyperlipidemia, MI, hypertension.  Paternal uncle with MI.  2 brothers with hypertension, diabetes, hyperlipidemia.  2 sisters.  Review of Systems  Respiratory: Negative.   Cardiovascular: Negative.   Gastrointestinal: Negative.   Neurological: Negative.         Objective:   Physical Exam Blood pressure 110/80 pulse 80 pulse oximetry 97% weight 210 pounds BMI 33.64  Skin is warm and dry.  She does have  scalp dermatitis which has been present for a number of years.  No cervical adenopathy.  No thyromegaly.  No carotid bruits.  TMs are clear.  Chest clear to auscultation.  Cardiac exam: Regular rate and rhythm normal S1 and S2.  Abdomen soft nondistended without hepatosplenomegaly masses or tenderness.  GYN exam is deferred to gynecologist.  Neuro: Intact without focal deficits.  Ambulates help judgment are normal.       Assessment & Plan:  Family history of coronary disease in her brother.  She had calcium scoring in 2019 which was 11.  She has seen cardiologist and is on cholesterol-lowering medication.  Hyperlipidemia-Cardiologist has given her Vascepa and Crestor  Anxiety treated with as needed Xanax  GE reflux treated with PPI  Dependent edema treated with Lasix as needed  Scalp dermatitis treated by Dr. Ubaldo Glassing  History of vitamin D deficiency  BMI 33.64 patient continue to work on diet and exercise  Mild hypothyroidism treated with low-dose thyroid replacement medication  History of sleep apnea   She has lab work done through [facility].  Fasting glucose was 108, BUN and creatinine were normal.  Electrolytes were normal.  Hemoglobin A1c mildly elevated at 5.7% normal being at 5.6%.  Triglycerides elevated at 247.  Total cholesterol 189, HDL low at 39.  LDL 107.  TSH normal at 1.47.  CBC normal with the exception of MCV slightly low at 79.4.  Normal is 80-100.  Plan: Continue current meds.  Patient needs to proceed with colonoscopy. Follow up in 6 months.

## 2020-10-02 LAB — CYTOLOGY - PAP
Adequacy: ABSENT
Comment: NEGATIVE
Diagnosis: NEGATIVE
High risk HPV: NEGATIVE

## 2020-11-22 ENCOUNTER — Other Ambulatory Visit: Payer: Self-pay | Admitting: Obstetrics and Gynecology

## 2020-11-22 DIAGNOSIS — Z1231 Encounter for screening mammogram for malignant neoplasm of breast: Secondary | ICD-10-CM

## 2020-11-23 DIAGNOSIS — Z1231 Encounter for screening mammogram for malignant neoplasm of breast: Secondary | ICD-10-CM | POA: Diagnosis not present

## 2020-11-23 LAB — HM MAMMOGRAPHY

## 2020-11-24 ENCOUNTER — Encounter: Payer: Self-pay | Admitting: Internal Medicine

## 2020-11-28 NOTE — Patient Instructions (Addendum)
It was a pleasure to see you today. Continue current meds and follow up in 6 months.

## 2020-12-01 ENCOUNTER — Other Ambulatory Visit: Payer: Self-pay | Admitting: Internal Medicine

## 2021-01-28 ENCOUNTER — Other Ambulatory Visit: Payer: Self-pay | Admitting: Internal Medicine

## 2021-01-28 ENCOUNTER — Other Ambulatory Visit: Payer: Self-pay | Admitting: Cardiology

## 2021-01-30 ENCOUNTER — Other Ambulatory Visit: Payer: Self-pay | Admitting: Internal Medicine

## 2021-01-30 ENCOUNTER — Telehealth: Payer: Self-pay | Admitting: Internal Medicine

## 2021-01-30 MED ORDER — ROSUVASTATIN CALCIUM 5 MG PO TABS: 5.0000 mg | ORAL_TABLET | Freq: Every day | ORAL | 3 refills | Status: DC

## 2021-01-30 MED ORDER — ICOSAPENT ETHYL 1 G PO CAPS: ORAL_CAPSULE | ORAL | 3 refills | Status: DC

## 2021-01-30 NOTE — Telephone Encounter (Signed)
Refill these 2 for one year

## 2021-01-30 NOTE — Telephone Encounter (Signed)
Nancy Lawrence [phone]  [name] called about some of her refills, she wanted to know if you would do these 2 instead of cardiology, because she has not seen them in a long time. I did notice that they did fill her Crestor but said she needs an OV before they would fill again. I also ask her to have pharmacy send request on anything else that she needs and we schedule appointment for her in May  VASCEPA 1 g capsule rosuvastatin (CRESTOR) 5 MG tablet

## 2021-02-20 DIAGNOSIS — U071 COVID-19: Secondary | ICD-10-CM | POA: Diagnosis not present

## 2021-02-20 DIAGNOSIS — Z20822 Contact with and (suspected) exposure to covid-19: Secondary | ICD-10-CM | POA: Diagnosis not present

## 2021-03-27 DIAGNOSIS — Z131 Encounter for screening for diabetes mellitus: Secondary | ICD-10-CM | POA: Diagnosis not present

## 2021-03-27 DIAGNOSIS — Z0000 Encounter for general adult medical examination without abnormal findings: Secondary | ICD-10-CM | POA: Diagnosis not present

## 2021-03-27 DIAGNOSIS — Z13220 Encounter for screening for lipoid disorders: Secondary | ICD-10-CM | POA: Diagnosis not present

## 2021-03-27 DIAGNOSIS — Z1329 Encounter for screening for other suspected endocrine disorder: Secondary | ICD-10-CM | POA: Diagnosis not present

## 2021-03-27 LAB — BASIC METABOLIC PANEL
BUN: 10 (ref 4–21)
CO2: 24 — AB (ref 13–22)
Chloride: 102 (ref 99–108)
Creatinine: 0.8 (ref 0.5–1.1)
Glucose: 96
Potassium: 4.5 (ref 3.4–5.3)
Sodium: 141 (ref 137–147)

## 2021-03-27 LAB — COMPREHENSIVE METABOLIC PANEL
Albumin: 4.6 (ref 3.5–5.0)
Calcium: 9.8 (ref 8.7–10.7)
Globulin: 2.5

## 2021-03-27 LAB — CBC AND DIFFERENTIAL
HCT: 35 — AB (ref 36–46)
Hemoglobin: 11.9 — AB (ref 12.0–16.0)
Platelets: 314 (ref 150–399)
WBC: 5.1

## 2021-03-27 LAB — HEPATIC FUNCTION PANEL
ALT: 20 (ref 7–35)
AST: 20 (ref 13–35)
Alkaline Phosphatase: 107 (ref 25–125)
Bilirubin, Total: 0.4

## 2021-03-27 LAB — CBC: RBC: 4.85 (ref 3.87–5.11)

## 2021-03-27 LAB — LIPID PANEL
Cholesterol: 161 (ref 0–200)
HDL: 35 (ref 35–70)
LDL Cholesterol: 97
Triglycerides: 167 — AB (ref 40–160)

## 2021-03-27 LAB — TSH: TSH: 1.25 (ref 0.41–5.90)

## 2021-03-27 LAB — HEMOGLOBIN A1C: Hemoglobin A1C: 5.6

## 2021-03-29 ENCOUNTER — Ambulatory Visit: Payer: Federal, State, Local not specified - PPO | Admitting: Internal Medicine

## 2021-04-03 ENCOUNTER — Other Ambulatory Visit: Payer: Self-pay

## 2021-04-03 ENCOUNTER — Ambulatory Visit: Payer: Federal, State, Local not specified - PPO | Admitting: Internal Medicine

## 2021-04-03 VITALS — BP 100/80 | HR 80 | Ht 66.0 in | Wt 205.0 lb

## 2021-04-03 DIAGNOSIS — Z6833 Body mass index (BMI) 33.0-33.9, adult: Secondary | ICD-10-CM

## 2021-04-03 DIAGNOSIS — R609 Edema, unspecified: Secondary | ICD-10-CM

## 2021-04-03 DIAGNOSIS — E782 Mixed hyperlipidemia: Secondary | ICD-10-CM | POA: Diagnosis not present

## 2021-04-03 DIAGNOSIS — K219 Gastro-esophageal reflux disease without esophagitis: Secondary | ICD-10-CM

## 2021-04-03 DIAGNOSIS — R718 Other abnormality of red blood cells: Secondary | ICD-10-CM

## 2021-04-03 DIAGNOSIS — Z86010 Personal history of colonic polyps: Secondary | ICD-10-CM

## 2021-04-03 DIAGNOSIS — E039 Hypothyroidism, unspecified: Secondary | ICD-10-CM

## 2021-04-03 DIAGNOSIS — L219 Seborrheic dermatitis, unspecified: Secondary | ICD-10-CM

## 2021-04-03 DIAGNOSIS — Z8719 Personal history of other diseases of the digestive system: Secondary | ICD-10-CM

## 2021-04-03 DIAGNOSIS — Z8659 Personal history of other mental and behavioral disorders: Secondary | ICD-10-CM

## 2021-04-03 DIAGNOSIS — Z860101 Personal history of adenomatous and serrated colon polyps: Secondary | ICD-10-CM

## 2021-04-03 DIAGNOSIS — E781 Pure hyperglyceridemia: Secondary | ICD-10-CM

## 2021-04-03 MED ORDER — PHENTERMINE HCL 30 MG PO CAPS: 30.0000 mg | ORAL_CAPSULE | ORAL | 0 refills | Status: DC

## 2021-04-03 MED ORDER — ALPRAZOLAM 0.5 MG PO TABS
0.5000 mg | ORAL_TABLET | Freq: Two times a day (BID) | ORAL | 1 refills | Status: DC | PRN
Start: 2021-04-03 — End: 2021-11-29

## 2021-04-03 MED ORDER — VALACYCLOVIR HCL 1 G PO TABS: ORAL_TABLET | ORAL | 0 refills | Status: AC

## 2021-04-03 NOTE — Patient Instructions (Addendum)
Have refilled Xanax . Have given small  quantity  of phentermine with upcoming family wedding.  Please continue with Crestor.  Please have iron and iron binding capacity drawn at your lab with results forwarded to me due to low MCV.  You may have iron deficiency.

## 2021-04-03 NOTE — Progress Notes (Signed)
   Subjective:    Patient ID: Nancy Lawrence, female    DOB: 12-27-1960, 60 y.o.   MRN: [id number]  HPI 60 year old Female in today for 58-month recheck.  Daughter is getting married in the near future and patient would like to have some phentermine for weight loss and was given a 30-day supply of phentermine 30 mg to take 1 capsule daily.  She brings in fasting labs from her work.  BUN and creatinine are normal.  Hemoglobin A1c excellent at 5.6%.  Electrolytes are normal.  Hemoglobin normal at 11.9 g.  MCV low at 73.  Electrolytes are normal.  Liver functions are normal.  Total cholesterol is 161, triglycerides elevated at 167 normal being up to 149.  She has low HDL of 35.  LDL is normal at 97.  In November, her MCV was near normal at 79.4 normal being between 80 and 100.  She had colonoscopy in 2017 with 3-year follow-up recommended.  This is now past due.  Review of Systems see above     Objective:   Physical Exam  Has lost 5 pounds since November 2021 Skin: Warm and dry.  No cervical adenopathy or thyromegaly.  Chest clear.  Cardiac exam: Regular rate and rhythm.  No lower extremity pitting edema.       Assessment & Plan:  Low MCV-could have iron deficiency.  Needs to have colonoscopy as last study was done in 2017 and 3-year follow-up was recommended at that time.  Suggest that she have iron and iron binding capacity drawn as well as repeat CBC looking at MCV again soon.  Mild hypertriglyceridemia  Low HDL cholesterol  BMI 33.09.  Small quantity of phentermine given because daughter is getting married in the near future and she would like to lose a bit of weight.  Hypothyroidism-continue with levothyroxine 0.075 mg daily.  TSH is stable.  History of GE reflux treated with Prilosec 20 mg daily.  Hyperlipidemia-was started on Crestor 5 mg daily in March.  Lipids have normalized.  Previously in November 2021 triglycerides were 247 and are now 167.  Total cholesterol has  decreased from 1 89 to 161 LDL has decreased from 107 to 97  Low HDL of 35  Plan: Continue current medications and follow-up with health maintenance exam in November.  Would like for her to get iron and iron binding capacity in the interim.  Reminded about colonoscopy.  Xanax refilled.  Small quantity of phentermine given for weight loss with upcoming family wedding.

## 2021-04-29 ENCOUNTER — Other Ambulatory Visit: Payer: Self-pay | Admitting: Internal Medicine

## 2021-05-19 ENCOUNTER — Encounter: Payer: Self-pay | Admitting: Internal Medicine

## 2021-05-29 ENCOUNTER — Other Ambulatory Visit: Payer: Self-pay | Admitting: Internal Medicine

## 2021-06-30 ENCOUNTER — Other Ambulatory Visit: Payer: Self-pay | Admitting: Internal Medicine

## 2021-07-24 ENCOUNTER — Encounter: Payer: Self-pay | Admitting: Internal Medicine

## 2021-08-11 ENCOUNTER — Other Ambulatory Visit: Payer: Self-pay | Admitting: Internal Medicine

## 2021-10-28 ENCOUNTER — Other Ambulatory Visit: Payer: Self-pay | Admitting: Internal Medicine

## 2021-10-29 ENCOUNTER — Encounter: Payer: Self-pay | Admitting: Internal Medicine

## 2021-11-29 ENCOUNTER — Other Ambulatory Visit: Payer: Self-pay

## 2021-11-29 ENCOUNTER — Encounter: Payer: Self-pay | Admitting: Internal Medicine

## 2021-11-29 ENCOUNTER — Ambulatory Visit (INDEPENDENT_AMBULATORY_CARE_PROVIDER_SITE_OTHER): Payer: Federal, State, Local not specified - PPO | Admitting: Internal Medicine

## 2021-11-29 VITALS — BP 126/82 | HR 74 | Temp 98.4°F | Ht 67.0 in | Wt 221.5 lb

## 2021-11-29 DIAGNOSIS — Z13220 Encounter for screening for lipoid disorders: Secondary | ICD-10-CM | POA: Diagnosis not present

## 2021-11-29 DIAGNOSIS — E782 Mixed hyperlipidemia: Secondary | ICD-10-CM

## 2021-11-29 DIAGNOSIS — R609 Edema, unspecified: Secondary | ICD-10-CM

## 2021-11-29 DIAGNOSIS — Z8639 Personal history of other endocrine, nutritional and metabolic disease: Secondary | ICD-10-CM

## 2021-11-29 DIAGNOSIS — E039 Hypothyroidism, unspecified: Secondary | ICD-10-CM | POA: Diagnosis not present

## 2021-11-29 DIAGNOSIS — Z8659 Personal history of other mental and behavioral disorders: Secondary | ICD-10-CM

## 2021-11-29 DIAGNOSIS — Z0000 Encounter for general adult medical examination without abnormal findings: Secondary | ICD-10-CM

## 2021-11-29 DIAGNOSIS — D509 Iron deficiency anemia, unspecified: Secondary | ICD-10-CM

## 2021-11-29 DIAGNOSIS — Z1231 Encounter for screening mammogram for malignant neoplasm of breast: Secondary | ICD-10-CM | POA: Diagnosis not present

## 2021-11-29 DIAGNOSIS — K219 Gastro-esophageal reflux disease without esophagitis: Secondary | ICD-10-CM

## 2021-11-29 DIAGNOSIS — F439 Reaction to severe stress, unspecified: Secondary | ICD-10-CM

## 2021-11-29 DIAGNOSIS — Z1329 Encounter for screening for other suspected endocrine disorder: Secondary | ICD-10-CM | POA: Diagnosis not present

## 2021-11-29 DIAGNOSIS — Z131 Encounter for screening for diabetes mellitus: Secondary | ICD-10-CM | POA: Diagnosis not present

## 2021-11-29 DIAGNOSIS — Z86010 Personal history of colonic polyps: Secondary | ICD-10-CM

## 2021-11-29 DIAGNOSIS — G4733 Obstructive sleep apnea (adult) (pediatric): Secondary | ICD-10-CM

## 2021-11-29 LAB — POCT URINALYSIS DIPSTICK
Bilirubin, UA: NEGATIVE
Blood, UA: NEGATIVE
Glucose, UA: NEGATIVE
Ketones, UA: NEGATIVE
Leukocytes, UA: NEGATIVE
Nitrite, UA: NEGATIVE
Protein, UA: NEGATIVE
Spec Grav, UA: 1.015 (ref 1.010–1.025)
Urobilinogen, UA: 0.2 E.U./dL
pH, UA: 5 (ref 5.0–8.0)

## 2021-11-29 MED ORDER — ALPRAZOLAM 0.5 MG PO TABS: 0.5000 mg | ORAL_TABLET | Freq: Two times a day (BID) | ORAL | 3 refills | Status: DC | PRN

## 2021-11-29 NOTE — Patient Instructions (Addendum)
Xanax refilled. Labs are pending from GYN office. Mammogram done at GYN office today. RTC in one year or as needed pending review of labs. Needs colonoscopy. Needs tetanus update when having less stress in life.

## 2021-11-29 NOTE — Progress Notes (Signed)
Subjective:    Patient ID: Nancy Lawrence, female    DOB: Mar 10, 1961, 61 y.o.   MRN: [id number]  HPI 61 year old Female for health maintenance exam and  evaluation of medical issues.  Recently retired from [employer]. Daughter had infant recently needing specialized care at [facility]. This has been stressful with lots of trips to [facility] but baby is improving.  Had labs drawn at GYN office today.  She saw a cardiologist in 2019 because her brother had sudden cardiac death.  She had a CT coronary calcium scoring study which was excellent at 11.  Also a gram glass opacity was seen in the upper lobe of right lung.  She was seen by pulmonologist in 2020.  Study was repeated and opacity was no longer seen.  No further imaging was indicated according to pulmonary.  Past due for colonoscopy.  Referral was placed to [facility] and she was reminded.  They have reached out to her by email.  History of mild sleep apnea  History of scalp dermatitis treated by Dr. Ubaldo Glassing.  She is menopausal and has had issues with hot flashes.  History of vitamin D deficiency, dependent edema, hyperlipidemia, anxiety, GE reflux and vertigo.  Takes  thyroid replacement for elevated TSH.  Has Mirena device per OB/GYN.  Takes Lasix for dependent edema.    Takes PPI for GE reflux.   For hyperlipidemia is on Vascepa 2 capsules twice daily and Crestor 5 mg daily.   Also takes Advil 600 mg 3 times a day as needed for musculoskeletal pain.  With situational stress have prescribed Xanax 0.5 mg twice daily as needed for anxiety.  Social history: Married.  2 daughters.  She completed 2 years of college.  Does not smoke.  Social alcohol consumption.  Family history: Brother died of an acute MI.  Father with history of diabetes, heart disease, hyperlipidemia, MI, hypertension.  Paternal uncle with MI.  2 brothers with hypertension, diabetes, hyperlipidemia.  2  sisters.       Review of Systems still has Mirena device- will see Laurin Coder about this. Will likely have this removed.     Objective:   Physical Exam  BP 126/82 pulse 74 regular, temperature 98.4 degrees pulse oximetry 98% BMI 34.69 weight 221 pounds 8 ounces.  Skin: Warm and dry.  Has scalp dermatitis.  TMs clear.  Pharynx clear.  Neck is supple without JVD thyromegaly or carotid bruits.  Chest is clear to auscultation.  Cardiac exam: Regular rate and rhythm without ectopy.  Abdomen is soft nondistended without hepatosplenomegaly masses or tenderness.  No significant lower extremity edema today.  Brief neurological exam intact without focal deficits.       Assessment & Plan:  Situational stress with grandchild hospitalized and neonatal intensive care unit at [facility]  History of anxiety-treated with Xanax  History of coronary disease in her brother.  She has been seen by cardiologist and is on cholesterol-lowering medication  GE reflux treated with PPI  Dependent edema treated with as needed Lasix  Scalp dermatitis treated by Dr. Ubaldo Glassing  History of vitamin D deficiency  BMI 34-increased from 33.64 last year.  Needs to work on diet exercise and weight loss it may be now that she is retired she can take some time for herself.  Mild hypothyroidism treated with thyroid replacement medication  History of sleep apnea.  Plan: Need to review fasting labs drawn at [place] and advise further.  Addendum: Has been found to have microcytic anemia and will have office visit January 27.

## 2021-12-04 ENCOUNTER — Telehealth: Payer: Self-pay | Admitting: Internal Medicine

## 2021-12-04 NOTE — Telephone Encounter (Signed)
Scheduled

## 2021-12-04 NOTE — Telephone Encounter (Signed)
Nancy Lawrence [phone]  Nancy Lawrence called to say on her labs her hemoglobin was 9.6, she had been given blood, she has also been stressed and fatigued, she also stated she has been craving ice and feeling light headed should she be taking a iron supplement. She has Accrufer on hand and can start taking it.

## 2021-12-07 ENCOUNTER — Other Ambulatory Visit: Payer: Self-pay | Admitting: Internal Medicine

## 2021-12-07 ENCOUNTER — Encounter: Payer: Self-pay | Admitting: Internal Medicine

## 2021-12-07 ENCOUNTER — Ambulatory Visit: Payer: Federal, State, Local not specified - PPO | Admitting: Internal Medicine

## 2021-12-07 ENCOUNTER — Other Ambulatory Visit: Payer: Self-pay

## 2021-12-07 VITALS — BP 116/84 | HR 85 | Temp 98.0°F | Resp 16 | Ht 67.0 in | Wt 224.0 lb

## 2021-12-07 DIAGNOSIS — E611 Iron deficiency: Secondary | ICD-10-CM | POA: Diagnosis not present

## 2021-12-07 NOTE — Patient Instructions (Addendum)
Please take over-the-counter iron supplement that she shows me.  Take it once a day.  Please forego donating blood for now.  Colonoscopy due per Dr. Havery Moros.  Referral will be made.  Return in 3 months for follow-up on iron deficiency anemia.  Iron and iron binding capacity along with ferritin drawn today.  These will need to be repeated a few days before your 6-month follow-up appointment.

## 2021-12-07 NOTE — Progress Notes (Signed)
° °  Subjective:    Patient ID: Nancy Lawrence, female    DOB: February 01, 1961, 61 y.o.   MRN: [id number]  HPI 61 year old Female seen for follow-up.  She had recent health maintenance exam here.  She had labs drawn at [place] where she was employed until very recently when she retired.  These labs were drawn on January 19 in anticipation of her physical exam on the same day.  Her hemoglobin is 9.7 g, MCV 62.5 and platelet count 374,000.  White blood cell count 4100.  Patient notes that she has been craving ice for the past few months and did not know why.  Denies melena or bright red blood per rectum.  Recently traveling to [facility] where her grandchild has been in the Neonatal intensive care nursery at [facility] with an infection.  Her mother is not well and she has been taking her mother for appointments and treatment.  Tells me today that she has been donating blood at the [facility] on a regular basis perhaps as often as every 3 months.  Her hemoglobin in May 2022 was normal at 14.6 g and MCV was normal at 92.3  In November 2021 her hemoglobin was normal at 12.2 grams,  She had colonoscopy by Dr. Carlean Purl.  3 tubular adenomas were found.  Review of Systems some fatigue that she attributed to recent situational stress.  Denies melena or bright red blood per rectum.     Objective:   Physical Exam Blood pressure 116/84 pulse 85 respiratory rate 16 temperature 98 degrees pulse oximetry 97% weight 224 pounds BMI 35.08  Seen today in no acute distress.  Discussion regarding iron deficiency and probable cause for that.  She should forego blood donations for the present time and make sure she is eating some iron rich foods.     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 Carlean Purl.  Referral will be made  Plan: Patient advised to stop blood  donations for the present time.  She has an iron supplement that she will be taking once daily that I have reviewed and approved.  She will follow-up in 3-year months.  At that time she will need CBC with differential, ferritin, iron and iron binding capacity.  She says she will come here to have lab drawn since she is now retired from [employer].

## 2021-12-08 LAB — EXTRA LAV TOP TUBE

## 2021-12-08 LAB — CLIENT EDUCATION TRACKING

## 2021-12-08 LAB — IRON,TIBC AND FERRITIN PANEL
%SAT: 3 % (calc) — ABNORMAL LOW (ref 16–45)
Ferritin: 3 ng/mL — ABNORMAL LOW (ref 16–288)
Iron: 16 ug/dL — ABNORMAL LOW (ref 45–160)
TIBC: 485 mcg/dL (calc) — ABNORMAL HIGH (ref 250–450)

## 2021-12-20 DIAGNOSIS — B079 Viral wart, unspecified: Secondary | ICD-10-CM | POA: Diagnosis not present

## 2021-12-20 DIAGNOSIS — B078 Other viral warts: Secondary | ICD-10-CM | POA: Diagnosis not present

## 2021-12-20 DIAGNOSIS — D485 Neoplasm of uncertain behavior of skin: Secondary | ICD-10-CM | POA: Diagnosis not present

## 2022-01-23 DIAGNOSIS — D1801 Hemangioma of skin and subcutaneous tissue: Secondary | ICD-10-CM | POA: Diagnosis not present

## 2022-01-23 DIAGNOSIS — L821 Other seborrheic keratosis: Secondary | ICD-10-CM | POA: Diagnosis not present

## 2022-01-23 DIAGNOSIS — L578 Other skin changes due to chronic exposure to nonionizing radiation: Secondary | ICD-10-CM | POA: Diagnosis not present

## 2022-01-23 DIAGNOSIS — B078 Other viral warts: Secondary | ICD-10-CM | POA: Diagnosis not present

## 2022-01-23 DIAGNOSIS — L814 Other melanin hyperpigmentation: Secondary | ICD-10-CM | POA: Diagnosis not present

## 2022-01-29 ENCOUNTER — Ambulatory Visit (AMBULATORY_SURGERY_CENTER): Payer: Federal, State, Local not specified - PPO | Admitting: *Deleted

## 2022-01-29 ENCOUNTER — Other Ambulatory Visit: Payer: Self-pay

## 2022-01-29 VITALS — Ht 66.5 in | Wt 213.0 lb

## 2022-01-29 DIAGNOSIS — Z862 Personal history of diseases of the blood and blood-forming organs and certain disorders involving the immune mechanism: Secondary | ICD-10-CM

## 2022-01-29 DIAGNOSIS — Z86010 Personal history of colonic polyps: Secondary | ICD-10-CM

## 2022-01-29 NOTE — Progress Notes (Signed)
No egg or soy allergy known to patient  ?No issues known to pt with past sedation with any surgeries or procedures ?Patient denies ever being told they had issues or difficulty with intubation  ?No FH of Malignant Hyperthermia ?Pt is not on diet pills ?Pt is not on  home 02  ?Pt is not on blood thinners  ?Pt denies issues with constipation  ?No A fib or A flutter ? ?Pt is fully vaccinated  for Covid  ? ? ?Due to the COVID-19 pandemic we are asking patients to follow certain guidelines in PV and the [place]   ?Pt aware of COVID protocols and [facility] guidelines  ? ?PV completed over the phone. Pt verified name, DOB, address and insurance during PV today.  ?Pt mailed instruction packet with copy of consent form to read and not return, and instructions.  ?Pt encouraged to call with questions or issues.  ? ?

## 2022-01-31 ENCOUNTER — Other Ambulatory Visit: Payer: Self-pay | Admitting: Internal Medicine

## 2022-02-03 ENCOUNTER — Other Ambulatory Visit: Payer: Self-pay | Admitting: Internal Medicine

## 2022-02-05 ENCOUNTER — Encounter: Payer: Self-pay | Admitting: Internal Medicine

## 2022-02-05 ENCOUNTER — Ambulatory Visit (AMBULATORY_SURGERY_CENTER): Payer: Federal, State, Local not specified - PPO | Admitting: Internal Medicine

## 2022-02-05 VITALS — BP 130/53 | HR 57 | Temp 97.5°F | Resp 16 | Ht 67.0 in | Wt 213.0 lb

## 2022-02-05 DIAGNOSIS — Z52008 Unspecified donor, other blood: Secondary | ICD-10-CM | POA: Insufficient documentation

## 2022-02-05 DIAGNOSIS — D120 Benign neoplasm of cecum: Secondary | ICD-10-CM

## 2022-02-05 DIAGNOSIS — Z86010 Personal history of colonic polyps: Secondary | ICD-10-CM | POA: Diagnosis not present

## 2022-02-05 DIAGNOSIS — D509 Iron deficiency anemia, unspecified: Secondary | ICD-10-CM

## 2022-02-05 DIAGNOSIS — Z1211 Encounter for screening for malignant neoplasm of colon: Secondary | ICD-10-CM | POA: Diagnosis not present

## 2022-02-05 HISTORY — DX: Unspecified donor, other blood: Z52.008

## 2022-02-05 MED ORDER — SODIUM CHLORIDE 0.9 % IV SOLN: 500.0000 mL | INTRAVENOUS | Status: DC

## 2022-02-05 NOTE — Op Note (Addendum)
[facility] ?Patient Name: Nancy Lawrence ?Procedure Date: 02/05/2022 11:59 AM ?MRN: [id number] ?Endoscopist: Gatha Mayer , MD ?Age: 61 ?Referring MD:  ?Date of Birth: 1960/11/29 ?Gender: Female ?Account #: [id number] ?Procedure:                Colonoscopy ?Indications:              Surveillance: Personal history of colonic polyps  ?                          (unknown histology) on last colonoscopy more than 5  ?                          years ago, Last colonoscopy: May 2017 ?Medicines:                Monitored Anesthesia Care ?Procedure:                Pre-Anesthesia Assessment: ?                          - Prior to the procedure, a History and Physical  ?                          was performed, and patient medications and  ?                          allergies were reviewed. The patient's tolerance of  ?                          previous anesthesia was also reviewed. The risks  ?                          and benefits of the procedure and the sedation  ?                          options and risks were discussed with the patient.  ?                          All questions were answered, and informed consent  ?                          was obtained. Prior Anticoagulants: The patient has  ?                          taken no previous anticoagulant or antiplatelet  ?                          agents. ASA Grade Assessment: III - A patient with  ?                          severe systemic disease. After reviewing the risks  ?                          and benefits, the patient was deemed in  ?  satisfactory condition to undergo the procedure. ?                          After obtaining informed consent, the colonoscope  ?                          was passed under direct vision. Throughout the  ?                          procedure, the patient's blood pressure, pulse, and  ?                          oxygen saturations were monitored continuously. The  ?                          Olympus  [id number]-[id number] (#[id number]) Colonoscope was  ?                          introduced through the anus and advanced to the the  ?                          cecum, identified by appendiceal orifice and  ?                          ileocecal valve. The colonoscopy was performed  ?                          without difficulty. The patient tolerated the  ?                          procedure well. The quality of the bowel  ?                          preparation was good. The ileocecal valve,  ?                          appendiceal orifice, and rectum were photographed. ?Scope In: 12:09:20 PM ?Scope Out: 12:25:38 PM ?Scope Withdrawal Time: 0 hours 12 minutes 43 seconds  ?Total Procedure Duration: 0 hours 16 minutes 18 seconds  ?Findings:                 The perianal and digital rectal examinations were  ?                          normal. ?                          A diminutive polyp was found in the ileocecal  ?                          valve. The polyp was flat. The polyp was removed  ?                          with a cold snare. Resection and retrieval were  ?  complete. Verification of patient identification  ?                          for the specimen was done. Estimated blood loss was  ?                          minimal. ?                          Multiple diverticula were found in the sigmoid  ?                          colon. ?                          The exam was otherwise without abnormality on  ?                          direct and retroflexion views. ?Complications:            No immediate complications. ?Estimated Blood Loss:     Estimated blood loss was minimal. ?Impression:               - One diminutive polyp at the ileocecal valve,  ?                          removed with a cold snare. Resected and retrieved. ?                          - Diverticulosis in the sigmoid colon. ?                          - The examination was otherwise normal on direct  ?                          and retroflexion  views. ?                          - Personal history of colonic polyps. 3 diminutive  ?                          adenomas 2017 ?Recommendation:           - Patient has a contact number available for  ?                          emergencies. The signs and symptoms of potential  ?                          delayed complications were discussed with the  ?                          patient. Return to normal activities tomorrow.  ?                          Written discharge instructions were provided to the  ?  patient. ?                          - Resume previous diet. ?                          - Continue present medications. ?                          - Await pathology results. ?                          - Repeat colonoscopy is recommended for  ?                          surveillance. The colonoscopy date will be  ?                          determined after pathology results from today's  ?                          exam become available for review. ?                          - Consider EGD - she has iron deficiency but also  ?                          had it yrs ago so is it old/chronic or new? BLOOD  ?                          DONOR AND HAS STOPPED NO EGD ?Gatha Mayer, MD ?02/05/2022 12:33:59 PM ?This report has been signed electronically. ?

## 2022-02-05 NOTE — Progress Notes (Signed)
Pt's states no medical or surgical changes since previsit or office visit. 

## 2022-02-05 NOTE — Progress Notes (Signed)
Pt non-responsive, VVS, Report to RN  °

## 2022-02-05 NOTE — Progress Notes (Signed)
[facility] History and Physical ? ? ?Primary Care Physician:  Elby Showers, MD ? ? ?Reason for Procedure:   Hx colon polyps ? ?Plan:    colonoscopy ? ? ? ? ?HPI: Nancy Lawrence is a 61 y.o. female w/ hx 3 diminutive adenomas 2017 ? ? ?Past Medical History:  ?Diagnosis Date  ? Allergy   ? Anemia   ? Fluid retention   ? GERD (gastroesophageal reflux disease)   ? History of snoring   ? Hx of adenomatous colonic polyps 03/13/2016  ? Hyperlipidemia   ? Hypothyroid   ? Pneumonia   ? Pulmonary nodule   ? Sleep apnea   ? Vitamin D deficiency   ? ? ?Past Surgical History:  ?Procedure Laterality Date  ? CHOLECYSTECTOMY    ? COLONOSCOPY    ? HERNIA REPAIR    ? umbilical  ? ? ?Prior to Admission medications   ?Medication Sig Start Date End Date Taking? Authorizing Provider  ?cetirizine (ZYRTEC) 10 MG tablet Take 10 mg by mouth daily.   Yes [provider]  ?ibuprofen (ADVIL) 600 MG tablet TAKE 1 TABLET BY MOUTH THREE TIMES A DAY WITH FOOD AS NEEDED FOR PAIN 07/01/21  Yes Baxley, Cresenciano Lick, MD  ?icosapent Ethyl (VASCEPA) 1 g capsule TAKE 2 CAPSULES BY MOUTH 2 TIMES DAILY. 02/03/22  Yes Baxley, Cresenciano Lick, MD  ?levonorgestrel (MIRENA) 20 MCG/24HR IUD 1 each by Intrauterine route once.   Yes [provider]  ?levothyroxine (SYNTHROID) 75 MCG tablet TAKE 1 TABLET BY MOUTH EVERY DAY 01/31/22  Yes Baxley, Cresenciano Lick, MD  ?omeprazole (PRILOSEC) 20 MG capsule TAKE 1 CAPSULE BY MOUTH EVERY DAY 05/29/21  Yes Baxley, Cresenciano Lick, MD  ?rosuvastatin (CRESTOR) 5 MG tablet Take 1 tablet (5 mg total) by mouth daily. Patient needs OV 01/30/21  Yes Baxley, Cresenciano Lick, MD  ?ALPRAZolam Duanne Moron) 0.5 MG tablet Take 1 tablet (0.5 mg total) by mouth 2 (two) times daily as needed for anxiety. 11/29/21   Elby Showers, MD  ?clindamycin (CLINDAGEL) 1 % gel Apply topically. 01/23/22   [provider]  ?clobetasol ointment (TEMOVATE) 7.61 % Apply 1 application. topically 2 (two) times daily. 01/23/22   [provider]  ?[facility]  SUNSCREEN SPF 30 EX apply    [provider]  ?furosemide (LASIX) 40 MG tablet TAKE 1 TABLET BY MOUTH DAILY AS NEEDED ?Patient not taking: Reported on 01/29/2022 04/29/21   Elby Showers, MD  ?NEOMYCIN-POLYMYXIN-HYDROCORTISONE (CORTISPORIN) 1 % SOLN OTIC solution Place 3 drops into the left ear 4 (four) times daily. 02/26/19   Elby Showers, MD  ?valACYclovir (VALTREX) 1000 MG tablet Take 2 tabs by mouth at onset of symptoms and repeat dose once in 12 hours. 04/03/21   Elby Showers, MD  ? ? ?Current Outpatient Medications  ?Medication Sig Dispense Refill  ? cetirizine (ZYRTEC) 10 MG tablet Take 10 mg by mouth daily.    ? ibuprofen (ADVIL) 600 MG tablet TAKE 1 TABLET BY MOUTH THREE TIMES A DAY WITH FOOD AS NEEDED FOR PAIN 90 tablet 3  ? icosapent Ethyl (VASCEPA) 1 g capsule TAKE 2 CAPSULES BY MOUTH 2 TIMES DAILY. 180 capsule 3  ? levonorgestrel (MIRENA) 20 MCG/24HR IUD 1 each by Intrauterine route once.    ? levothyroxine (SYNTHROID) 75 MCG tablet TAKE 1 TABLET BY MOUTH EVERY DAY 90 tablet 0  ? omeprazole (PRILOSEC) 20 MG capsule TAKE 1 CAPSULE BY MOUTH EVERY DAY 90 capsule 3  ? rosuvastatin (CRESTOR)  5 MG tablet Take 1 tablet (5 mg total) by mouth daily. Patient needs OV 90 tablet 3  ? ALPRAZolam (XANAX) 0.5 MG tablet Take 1 tablet (0.5 mg total) by mouth 2 (two) times daily as needed for anxiety. 60 tablet 3  ? clindamycin (CLINDAGEL) 1 % gel Apply topically.    ? clobetasol ointment (TEMOVATE) 7.51 % Apply 1 application. topically 2 (two) times daily.    ? [facility]S SUNSCREEN SPF 30 EX apply    ? furosemide (LASIX) 40 MG tablet TAKE 1 TABLET BY MOUTH DAILY AS NEEDED (Patient not taking: Reported on 01/29/2022) 90 tablet 1  ? NEOMYCIN-POLYMYXIN-HYDROCORTISONE (CORTISPORIN) 1 % SOLN OTIC solution Place 3 drops into the left ear 4 (four) times daily. 10 mL 0  ? valACYclovir (VALTREX) 1000 MG tablet Take 2 tabs by mouth at onset of symptoms and repeat dose once in 12 hours. 28 tablet 0  ? ?Current  Facility-Administered Medications  ?Medication Dose Route Frequency Provider Last Rate Last Admin  ? 0.9 %  sodium chloride infusion  500 mL Intravenous Continuous Gatha Mayer, MD      ? ? ?Allergies as of 02/05/2022 - Review Complete 02/05/2022  ?Allergen Reaction Noted  ? Penicillins Rash 06/20/2011  ? Statins  03/05/2016  ? Citalopram hydrobromide  06/20/2011  ? ? ?Family History  ?Problem Relation Age of Onset  ? Ovarian cancer Mother   ? Heart disease Father   ? Hyperlipidemia Father   ? Hypertension Father   ? Diabetes Father   ? Heart attack Brother   ? Diabetes Brother   ? Hypertension Brother   ? Hyperlipidemia Brother   ? Heart disease Brother   ? Colon polyps Neg Hx   ? Colon cancer Neg Hx   ? Esophageal cancer Neg Hx   ? Stomach cancer Neg Hx   ? Rectal cancer Neg Hx   ? ? ?Social History  ? ?Socioeconomic History  ? Marital status: Married  ?  Spouse name: Not on file  ? Number of children: 2  ? Years of education: Not on file  ? Highest education level: Not on file  ?Occupational History  ? Not on file  ?Tobacco Use  ? Smoking status: Former  ?  Packs/day: 0.50  ?  Years: 12.00  ?  Pack years: 6.00  ?  Types: Cigarettes  ?  Quit date: 03/05/1994  ?  Years since quitting: 27.9  ? Smokeless tobacco: Never  ?Vaping Use  ? Vaping Use: Never used  ?Substance and Sexual Activity  ? Alcohol use: Yes  ?  Alcohol/week: 1.0 standard drink  ?  Types: 1 Cans of beer per week  ?  Comment: socially  ? Drug use: No  ? Sexual activity: Not on file  ?Other Topics Concern  ? Not on file  ?Social History Narrative  ? Social history: Married.  2 daughters.  Husband is a Therapist, sports carrier.  She completed 2 years of college.  Works in [employer] with [employer].  She does not smoke.  Social alcohol consumption.  ?    ? Family history: Brother died of an acute MI.  Father with history of diabetes, heart disease, hyperlipidemia, MI, hypertension.  Paternal uncle with MI.  2 brothers with hypertension, diabetes,  hyperlipidemia.  2 sisters.  ? ?Social Determinants of Health  ? ?Financial Resource Strain: Not on file  ?Food Insecurity: Not on file  ?Transportation Needs: Not on file  ?Physical Activity: Not on file  ?Stress:  Not on file  ?Social Connections: Not on file  ?Intimate Partner Violence: Not on file  ? ? ?Review of Systems: ? ?All other review of systems negative except as mentioned in the HPI. ? ?Physical Exam: ?Vital signs ?BP (!) 144/69   Pulse 63   Temp (!) 97.5 ?F (36.4 ?C) (Temporal)   Ht '5\' 7"'$  (1.702 m)   Wt 213 lb (96.6 kg)   SpO2 96%   BMI 33.36 kg/m?  ? ?General:   Alert,  Well-developed, well-nourished, pleasant and cooperative in NAD ?Lungs:  Clear throughout to auscultation.   ?Heart:  Regular rate and rhythm; no murmurs, clicks, rubs,  or gallops. ?Abdomen:  Soft, nontender and nondistended. Normal bowel sounds.   ?Neuro/Psych:  Alert and cooperative. Normal mood and affect. A and O x 3 ? ? ?'@Mouhamadou Gittleman'$  Simonne Maffucci, MD, [facility] ?[facility] ?[phone] (pager) ?02/05/2022 12:00 PM@ ? ?

## 2022-02-05 NOTE — Progress Notes (Signed)
Called to room to assist during endoscopic procedure.  Patient ID and intended procedure confirmed with present staff. Received instructions for my participation in the procedure from the performing physician.  

## 2022-02-05 NOTE — Patient Instructions (Addendum)
I found one tiny polyp and removed it. ? ?You also have diverticulosis - thickened muscle rings and pouches in the colon wall. Please read the handout about this condition. ? ?I will let you know pathology results and when to have another routine colonoscopy by mail and/or My Chart. ? ?I appreciate the opportunity to care for you. ?Gatha Mayer, MD, [facility] ? ?Handouts given for polyps and diverticulosis. ? ?YOU HAD AN ENDOSCOPIC PROCEDURE TODAY AT [facility]:   Refer to the procedure report that was given to you for any specific questions about what was found during the examination.  If the procedure report does not answer your questions, please call your gastroenterologist to clarify.  If you requested that your care partner not be given the details of your procedure findings, then the procedure report has been included in a sealed envelope for you to review at your convenience later. ? ?YOU SHOULD EXPECT: Some feelings of bloating in the abdomen. Passage of more gas than usual.  Walking can help get rid of the air that was put into your GI tract during the procedure and reduce the bloating. If you had a lower endoscopy (such as a colonoscopy or flexible sigmoidoscopy) you may notice spotting of blood in your stool or on the toilet paper. If you underwent a bowel prep for your procedure, you may not have a normal bowel movement for a few days. ? ?Please Note:  You might notice some irritation and congestion in your nose or some drainage.  This is from the oxygen used during your procedure.  There is no need for concern and it should clear up in a day or so. ? ?SYMPTOMS TO REPORT IMMEDIATELY: ? ?Following lower endoscopy (colonoscopy): ? Excessive amounts of blood in the stool ? Significant tenderness or worsening of abdominal pains ? Swelling of the abdomen that is new, acute ? Fever of 100?F or higher ? ?For urgent or emergent issues, a gastroenterologist can be reached at any hour by calling  [phone]. ?Do not use MyChart messaging for urgent concerns.  ? ? ?DIET:  We do recommend a small meal at first, but then you may proceed to your regular diet.  Drink plenty of fluids but you should avoid alcoholic beverages for 24 hours. ? ?ACTIVITY:  You should plan to take it easy for the rest of today and you should NOT DRIVE or use heavy machinery until tomorrow (because of the sedation medicines used during the test).   ? ?FOLLOW UP: ?Our staff will call the number listed on your records 48-72 hours following your procedure to check on you and address any questions or concerns that you may have regarding the information given to you following your procedure. If we do not reach you, we will leave a message.  We will attempt to reach you two times.  During this call, we will ask if you have developed any symptoms of COVID 19. If you develop any symptoms (ie: fever, flu-like symptoms, shortness of breath, cough etc.) before then, please call [phone].  If you test positive for Covid 19 in the 2 weeks post procedure, please call and report this information to [place].   ? ?If any biopsies were taken you will be contacted by phone or by letter within the next 1-3 weeks.  Please call us at [phone] if you have not heard about the biopsies in 3 weeks.  ? ? ?SIGNATURES/CONFIDENTIALITY: ?You and/or your care partner have signed  paperwork which will be entered into your electronic medical record.  These signatures attest to the fact that that the information above on your After Visit Summary has been reviewed and is understood.  Full responsibility of the confidentiality of this discharge information lies with you and/or your care-partner.  ?

## 2022-02-07 ENCOUNTER — Telehealth: Payer: Self-pay | Admitting: *Deleted

## 2022-02-07 NOTE — Telephone Encounter (Signed)
?  Follow up Call- ? ? ?  02/05/2022  ? 11:05 AM  ?Call back number  ?Post procedure Call Back phone  # [phone]  ?Permission to leave phone message Yes  ?  ? ?Patient questions: ? ?Do you have a fever, pain , or abdominal swelling? No. ?Pain Score  0 * ? ?Have you tolerated food without any problems? Yes.   ? ?Have you been able to return to your normal activities? Yes.   ? ?Do you have any questions about your discharge instructions: ?Diet   No. ?Medications  No. ?Follow up visit  No. ? ?Do you have questions or concerns about your Care? No. ? ?Actions: ?* If pain score is 4 or above: ?No action needed, pain <4. ? ? ?

## 2022-02-15 ENCOUNTER — Encounter: Payer: Self-pay | Admitting: Internal Medicine

## 2022-03-22 ENCOUNTER — Other Ambulatory Visit: Payer: Self-pay | Admitting: Internal Medicine

## 2022-04-30 ENCOUNTER — Other Ambulatory Visit: Payer: Self-pay | Admitting: Internal Medicine

## 2022-05-22 ENCOUNTER — Other Ambulatory Visit: Payer: Self-pay | Admitting: Internal Medicine

## 2022-07-04 ENCOUNTER — Encounter: Payer: Self-pay | Admitting: Internal Medicine

## 2022-07-04 ENCOUNTER — Ambulatory Visit: Payer: Federal, State, Local not specified - PPO | Admitting: Internal Medicine

## 2022-07-04 ENCOUNTER — Telehealth: Payer: Self-pay | Admitting: Internal Medicine

## 2022-07-04 VITALS — BP 134/78 | HR 94 | Temp 98.1°F | Wt 205.8 lb

## 2022-07-04 DIAGNOSIS — F439 Reaction to severe stress, unspecified: Secondary | ICD-10-CM | POA: Diagnosis not present

## 2022-07-04 DIAGNOSIS — H6011 Cellulitis of right external ear: Secondary | ICD-10-CM

## 2022-07-04 DIAGNOSIS — Z8659 Personal history of other mental and behavioral disorders: Secondary | ICD-10-CM

## 2022-07-04 DIAGNOSIS — H9209 Otalgia, unspecified ear: Secondary | ICD-10-CM

## 2022-07-04 MED ORDER — ALPRAZOLAM 0.5 MG PO TABS: 0.5000 mg | ORAL_TABLET | Freq: Every evening | ORAL | 0 refills | Status: DC | PRN

## 2022-07-04 MED ORDER — METHYLPREDNISOLONE ACETATE 80 MG/ML IJ SUSP
80.0000 mg | Freq: Once | INTRAMUSCULAR | Status: AC
  Administered 2022-07-04: 80 mg via INTRAMUSCULAR

## 2022-07-04 MED ORDER — LEVOFLOXACIN 500 MG PO TABS: 500.0000 mg | ORAL_TABLET | Freq: Every day | ORAL | 0 refills | Status: DC

## 2022-07-04 NOTE — Telephone Encounter (Signed)
scheduled

## 2022-07-04 NOTE — Patient Instructions (Addendum)
Depomedrol 80 mg IM given for inflammation of left ear.  Xanax refilled due to situational stress.  May take Levaquin 500 mg daily with a meal for 10 days for possible cellulitis.  Call if not improving in 24 to 48 hours or sooner if worse.  Depo-Medrol may help with eczema/seborrhea of right earlobe

## 2022-07-04 NOTE — Progress Notes (Signed)
   Subjective:    Patient ID: Nancy Lawrence, female    DOB: 1961/10/05, 61 y.o.   MRN: [id number]  HPI 61 year old Female seen today on an urgent basis for right ear issue.  Patient says her right ear is swollen and painful.  Has history of some issues with seborrhea/eczema of the right earlobe causing cellulitis.  Has situational stress.  Daughter expecting second child and patient's mother is in poor health.  Patient is retired from [employer].  She has a history of mild sleep apnea, vitamin D deficiency, dependent edema, hyperlipidemia, anxiety, GE reflux, hypothyroidism, and vertigo.  Social history: Married.  2 daughters.  Does not smoke.  Social alcohol consumption.  Family history: Brother died of an acute MI.  Father with history of diabetes, heart disease, hyperlipidemia, MI and hypertension.  Paternal uncle with MI.  2 brothers with hypertension, diabetes, hyperlipidemia.  2 sisters.    Review of Systems see above-painful right ear lobe without fever or chills.  No sore throat or headache.     Objective:   Physical Exam There is some mild scaliness of the right earlobe.  It is quite tender to touch.  Right ear canal is slightly erythematous.  Right TM is normal.       Assessment & Plan:  Right otalgia-has mild cellulitis of right earlobe which has been an issue for her previously-Levaquin 500 mg daily for 10 days.  Depo-Medrol 80 mg IM to help with inflammation.  Anxiety state-Xanax refilled at patient request  Situational stress  30 minutes spent with patient discussing these issues and seeking solution to her right ear discomfort by checking up-to-date database

## 2022-07-04 NOTE — Telephone Encounter (Signed)
Nancy Lawrence [phone]  Nancy Lawrence called to say her right ear is swollen and hurting, she has some dry skin on her neck and ear that usually gets infected. She has started her ear drops but feels she may need antibiotic to get better. Do you want me to schedule appointment at 2:00 today?

## 2022-08-26 ENCOUNTER — Other Ambulatory Visit: Payer: Self-pay | Admitting: Internal Medicine

## 2022-09-28 ENCOUNTER — Other Ambulatory Visit: Payer: Self-pay | Admitting: Internal Medicine

## 2022-10-22 DIAGNOSIS — R350 Frequency of micturition: Secondary | ICD-10-CM | POA: Diagnosis not present

## 2022-10-22 DIAGNOSIS — Z124 Encounter for screening for malignant neoplasm of cervix: Secondary | ICD-10-CM | POA: Diagnosis not present

## 2022-10-22 DIAGNOSIS — N841 Polyp of cervix uteri: Secondary | ICD-10-CM | POA: Diagnosis not present

## 2022-10-22 DIAGNOSIS — Z30432 Encounter for removal of intrauterine contraceptive device: Secondary | ICD-10-CM | POA: Diagnosis not present

## 2022-10-22 DIAGNOSIS — R300 Dysuria: Secondary | ICD-10-CM | POA: Diagnosis not present

## 2022-10-26 ENCOUNTER — Other Ambulatory Visit: Payer: Self-pay | Admitting: Internal Medicine

## 2022-10-29 DIAGNOSIS — N950 Postmenopausal bleeding: Secondary | ICD-10-CM | POA: Diagnosis not present

## 2022-11-20 ENCOUNTER — Telehealth: Payer: Self-pay

## 2022-11-20 NOTE — Telephone Encounter (Signed)
Patient called asking if there is a test that can be added when she comes in for labs that could determine why she is having stomach and gut pain after eating a meal two weeks ago.  She said her bowel movements have been normal just the stomach and gut pain and cramps. Please advise

## 2022-11-22 NOTE — Telephone Encounter (Signed)
Patient informed of message she stated she is having an procedure on 11/25/22 at Ob/Gyn office that could be related to why she has been having stomach and gut pain and will call the office next week if she decide to move forward with stool study

## 2022-11-25 DIAGNOSIS — R85615 Unsatisfactory cytologic smear of anus: Secondary | ICD-10-CM | POA: Diagnosis not present

## 2022-11-25 DIAGNOSIS — N950 Postmenopausal bleeding: Secondary | ICD-10-CM | POA: Diagnosis not present

## 2022-11-28 ENCOUNTER — Other Ambulatory Visit: Payer: Federal, State, Local not specified - PPO

## 2022-11-28 DIAGNOSIS — Z8659 Personal history of other mental and behavioral disorders: Secondary | ICD-10-CM | POA: Diagnosis not present

## 2022-11-28 DIAGNOSIS — E039 Hypothyroidism, unspecified: Secondary | ICD-10-CM

## 2022-11-28 DIAGNOSIS — E782 Mixed hyperlipidemia: Secondary | ICD-10-CM

## 2022-11-28 DIAGNOSIS — R7302 Impaired glucose tolerance (oral): Secondary | ICD-10-CM

## 2022-11-29 NOTE — Addendum Note (Signed)
Addended by: Geradine Girt D on: 11/29/2022 04:06 PM   Modules accepted: Orders

## 2022-12-02 ENCOUNTER — Ambulatory Visit (INDEPENDENT_AMBULATORY_CARE_PROVIDER_SITE_OTHER): Payer: Federal, State, Local not specified - PPO | Admitting: Internal Medicine

## 2022-12-02 ENCOUNTER — Encounter: Payer: Self-pay | Admitting: Internal Medicine

## 2022-12-02 VITALS — BP 128/80 | HR 60 | Temp 98.4°F | Ht 66.0 in | Wt 209.1 lb

## 2022-12-02 DIAGNOSIS — L219 Seborrheic dermatitis, unspecified: Secondary | ICD-10-CM

## 2022-12-02 DIAGNOSIS — K219 Gastro-esophageal reflux disease without esophagitis: Secondary | ICD-10-CM

## 2022-12-02 DIAGNOSIS — Z0000 Encounter for general adult medical examination without abnormal findings: Secondary | ICD-10-CM

## 2022-12-02 DIAGNOSIS — Z8659 Personal history of other mental and behavioral disorders: Secondary | ICD-10-CM

## 2022-12-02 DIAGNOSIS — Z86010 Personal history of colonic polyps: Secondary | ICD-10-CM | POA: Diagnosis not present

## 2022-12-02 DIAGNOSIS — E039 Hypothyroidism, unspecified: Secondary | ICD-10-CM

## 2022-12-02 DIAGNOSIS — Z23 Encounter for immunization: Secondary | ICD-10-CM | POA: Diagnosis not present

## 2022-12-02 DIAGNOSIS — E782 Mixed hyperlipidemia: Secondary | ICD-10-CM

## 2022-12-02 DIAGNOSIS — F439 Reaction to severe stress, unspecified: Secondary | ICD-10-CM

## 2022-12-02 DIAGNOSIS — G4733 Obstructive sleep apnea (adult) (pediatric): Secondary | ICD-10-CM

## 2022-12-02 DIAGNOSIS — Z8639 Personal history of other endocrine, nutritional and metabolic disease: Secondary | ICD-10-CM

## 2022-12-02 DIAGNOSIS — R609 Edema, unspecified: Secondary | ICD-10-CM

## 2022-12-02 LAB — POCT URINALYSIS DIPSTICK
Bilirubin, UA: NEGATIVE
Glucose, UA: NEGATIVE
Ketones, UA: NEGATIVE
Leukocytes, UA: NEGATIVE
Nitrite, UA: NEGATIVE
Protein, UA: NEGATIVE
Spec Grav, UA: 1.02 (ref 1.010–1.025)
Urobilinogen, UA: 0.2 E.U./dL
pH, UA: 5 (ref 5.0–8.0)

## 2022-12-02 NOTE — Progress Notes (Signed)
Subjective:    Patient ID: Nancy Lawrence , female    DOB: 05/07/1961, 62 y.o.    MRN: [id number]   62 y.o. female presents today for CPE. Chief Complaint  Patient presents with   Annual Exam     Patient was last seen by me for CPE on 12/09/2021.  Patient reports having postmenopausal bleeding in 10/2022. She then had a polypectomy and endometrial biopsy which were negative. Her mother passed away in 09/16/23 from ovarian cancer. She is caring for her father who has dementia.  Health maintenance: She is scheduled for her mammogram later this week. She is UTD on her colonoscopy, last was 01/2022 and advised to repeat in 2030.  Anemia: Her last CBC was within normal limits on 11/28/2022.  GERD: Her symptoms have been well controlled with Omeprazole '20mg'$  daily.  HLD: She reports compliance with Crestor '5mg'$  daily. Hypertriglyceridemia: She has been compliant with Vascepa 2g BID. Her most recent lipid panel on 11/28/2022 showed a total cholesterol of 140, HDL of 40, triglycerides of 166, and LDL of 74. She had a CT calcium total score of 11 in 2019.  Hypothyroid: She reports compliance with Synthroid 59mg daily. Her last TSH was 1.10 on 11/28/2022.  Elevated LFTs: She had an ALT of 11/28/22. LFTs otherwise WNL. She reports having a x2 week GI viral illness at the start of the year. She very rarely drinks alcohol. She reports taking NSAIDs in November/December due to back pain.   Allergies: Her symptoms have been well controlled with Zyrtec '10mg'$  daily.  Insomnia: She states that she has had difficulty sleeping since her mother passed. She was taking Ibuprofen PM and Melatonin with minimal relief. She is now taking Xanax '5mg'$  every night.       Review of Systems  Constitutional:  Negative for fever and malaise/fatigue.  HENT:  Negative for congestion.   Eyes:  Negative for blurred vision.  Respiratory:  Negative for cough and shortness of breath.   Cardiovascular:  Negative for  chest pain, palpitations and leg swelling.  Gastrointestinal:  Negative for vomiting.  Musculoskeletal:  Negative for back pain.  Skin:  Negative for rash.  Neurological:  Negative for loss of consciousness and headaches.        Objective:   Vitals:   12/02/22 1455  BP: 128/80  Pulse: 60  Temp: 98.4 F (36.9 C)  SpO2: 100%   Lab Results  Component Value Date   CHOL 140 11/28/2022   HDL 40 (L) 11/28/2022   LDLCALC 74 11/28/2022   TRIG 166 (H) 11/28/2022   CHOLHDL 3.5 11/28/2022   Lab Results  Component Value Date   TSH 1.10 11/28/2022    Lab Results  Component Value Date   WBC 4.7 11/28/2022   HGB 14.2 11/28/2022   HCT 42.5 11/28/2022   MCV 85.5 11/28/2022   PLT 264 11/28/2022       Component Value Date/Time   NA 142 11/28/2022 0949   NA 141 03/27/2021 0000   K 4.8 11/28/2022 0949   CL 105 11/28/2022 0949   CO2 25 11/28/2022 0949   GLUCOSE 103 (H) 11/28/2022 0949   BUN 17 11/28/2022 0949   BUN 10 03/27/2021 0000   CREATININE 0.73 11/28/2022 0949   CALCIUM 9.7 11/28/2022 0949   PROT 6.8 11/28/2022 0949   ALBUMIN 4.6 03/27/2021 0000   AST 30 11/28/2022 0949   ALT 46 (H) 11/28/2022 0949   ALKPHOS 107 03/27/2021 0000   BILITOT 0.6  11/28/2022 0949     Physical Exam Vitals and nursing note reviewed.  Constitutional:      General: She is not in acute distress.    Appearance: Normal appearance. She is not ill-appearing or toxic-appearing.  HENT:     Head: Normocephalic and atraumatic.     Ears:     Comments: Mild cerumen bilateral ear canals Neck:     Thyroid: No thyroid mass, thyromegaly or thyroid tenderness.     Vascular: No carotid bruit.  Cardiovascular:     Rate and Rhythm: Regular rhythm. No extrasystoles are present.    Pulses: Normal pulses.     Heart sounds: No murmur heard. Pulmonary:     Effort: Pulmonary effort is normal.     Breath sounds: Normal breath sounds. No decreased breath sounds, wheezing, rhonchi or rales.  Chest:     Chest  wall: No tenderness.  Abdominal:     General: There is no distension.     Palpations: Abdomen is soft. There is no hepatomegaly, splenomegaly, mass or pulsatile mass.     Tenderness: There is no abdominal tenderness.     Hernia: No hernia is present.  Musculoskeletal:     Cervical back: Normal range of motion.     Right lower leg: No edema.     Left lower leg: No edema.  Lymphadenopathy:     Cervical: No cervical adenopathy.  Neurological:     General: No focal deficit present.     Mental Status: She is alert and oriented to person, place, and time. Mental status is at baseline.  Psychiatric:        Mood and Affect: Mood normal.        Behavior: Behavior normal.        Thought Content: Thought content normal.        Judgment: Judgment normal.          Assessment & Plan:    Hyperlipidemia and hypertriglyceridemia: CT calcium score of 11 in 2019. Will increase her Crestor to '10mg'$  daily.  Hypothyroidism: Well controlled on Synthroid 59mg daily.  Elevated LFTs: Advised to discontinue use of NSAIDs. Will repeat liver panel in 1 month at next OV.  Insomnia: Encouraged her to cut her Xanax dose in half to 2.'5mg'$  and continue to wean down.  Postmenopausal bleeding: She is being followed by OB for this. She had a negative endometrial biopsy and polypectomy.  Vaccine counseling: She received her flu vaccine today. She is considering getting the COVID booster, RSV, and pneumococcal vaccines at her pharmacy. She plans to get her tetanus booster at later visit.  Health maintenance: She has an upcoming mammogram later this week. She had her last colonoscopy 01/2022 with recall in 2030.    I,Alexis Herring,acting as a sEducation administratorfor MElby Showers MD.,have documented all relevant documentation on the behalf of MElby Showers MD,as directed by  MElby Showers MD while in the presence of MElby Showers MD.  I, MElby Showers MD, have reviewed all documentation for this visit. The  documentation on 12/08/22 for the exam, diagnosis, procedures, and orders are all accurate and complete.

## 2022-12-02 NOTE — Patient Instructions (Addendum)
Decrease anti-inflammatory medication and acteaminophen. Recheck LFTs in one month with OV. Try to decrease Xanax and come off of it in 2-3 months

## 2022-12-02 NOTE — Progress Notes (Signed)
Noted  

## 2022-12-03 LAB — CBC WITH DIFFERENTIAL/PLATELET
Absolute Monocytes: 343 cells/uL (ref 200–950)
Basophils Absolute: 28 cells/uL (ref 0–200)
Basophils Relative: 0.6 %
Eosinophils Absolute: 141 cells/uL (ref 15–500)
Eosinophils Relative: 3 %
HCT: 42.5 % (ref 35.0–45.0)
Hemoglobin: 14.2 g/dL (ref 11.7–15.5)
Lymphs Abs: 1283 cells/uL (ref 850–3900)
MCH: 28.6 pg (ref 27.0–33.0)
MCHC: 33.4 g/dL (ref 32.0–36.0)
MCV: 85.5 fL (ref 80.0–100.0)
MPV: 9.5 fL (ref 7.5–12.5)
Monocytes Relative: 7.3 %
Neutro Abs: 2905 cells/uL (ref 1500–7800)
Neutrophils Relative %: 61.8 %
Platelets: 264 10*3/uL (ref 140–400)
RBC: 4.97 10*6/uL (ref 3.80–5.10)
RDW: 13.3 % (ref 11.0–15.0)
Total Lymphocyte: 27.3 %
WBC: 4.7 10*3/uL (ref 3.8–10.8)

## 2022-12-03 LAB — HEMOGLOBIN A1C W/OUT EAG: Hgb A1c MFr Bld: 5.5 % of total Hgb (ref <5.7)

## 2022-12-03 LAB — COMPLETE METABOLIC PANEL WITH GFR
AG Ratio: 1.6 (calc) (ref 1.0–2.5)
ALT: 46 U/L — ABNORMAL HIGH (ref 6–29)
AST: 30 U/L (ref 10–35)
Albumin: 4.2 g/dL (ref 3.6–5.1)
Alkaline phosphatase (APISO): 85 U/L (ref 37–153)
BUN: 17 mg/dL (ref 7–25)
CO2: 25 mmol/L (ref 20–32)
Calcium: 9.7 mg/dL (ref 8.6–10.4)
Chloride: 105 mmol/L (ref 98–110)
Creat: 0.73 mg/dL (ref 0.50–1.05)
Globulin: 2.6 g/dL (calc) (ref 1.9–3.7)
Glucose, Bld: 103 mg/dL — ABNORMAL HIGH (ref 65–99)
Potassium: 4.8 mmol/L (ref 3.5–5.3)
Sodium: 142 mmol/L (ref 135–146)
Total Bilirubin: 0.6 mg/dL (ref 0.2–1.2)
Total Protein: 6.8 g/dL (ref 6.1–8.1)
eGFR: 93 mL/min/{1.73_m2} (ref >=60)

## 2022-12-03 LAB — LIPID PANEL
Cholesterol: 140 mg/dL (ref <200)
HDL: 40 mg/dL — ABNORMAL LOW (ref >=50)
LDL Cholesterol (Calc): 74 mg/dL (calc)
Non-HDL Cholesterol (Calc): 100 mg/dL (calc) (ref <130)
Total CHOL/HDL Ratio: 3.5 (calc) (ref <5.0)
Triglycerides: 166 mg/dL — ABNORMAL HIGH (ref <150)

## 2022-12-03 LAB — TSH: TSH: 1.1 mIU/L (ref 0.40–4.50)

## 2022-12-03 LAB — TEST AUTHORIZATION

## 2022-12-05 ENCOUNTER — Other Ambulatory Visit (HOSPITAL_BASED_OUTPATIENT_CLINIC_OR_DEPARTMENT_OTHER): Payer: Self-pay

## 2022-12-05 MED ORDER — AREXVY 120 MCG/0.5ML IM SUSR
INTRAMUSCULAR | 0 refills | Status: DC
  Filled 2022-12-05: qty 0.5, 1d supply, fill #0

## 2022-12-16 DIAGNOSIS — Z1231 Encounter for screening mammogram for malignant neoplasm of breast: Secondary | ICD-10-CM | POA: Diagnosis not present

## 2022-12-16 LAB — HM MAMMOGRAPHY

## 2022-12-17 ENCOUNTER — Encounter: Payer: Self-pay | Admitting: Internal Medicine

## 2022-12-27 NOTE — Progress Notes (Shared)
Patient Care Team: Elby Showers, MD as PCP - General (Internal Medicine) Lelon Perla, MD as PCP - Cardiology (Cardiology)  Visit Date: 12/27/22  Subjective:    Patient ID: Nancy Lawrence , Female   DOB: 1961/02/26, 62 y.o.    MRN: [id number]   61 y.o. Female presents today for a 1 month follow-up. Patient has a past medical history of anemia, GERD, hyperlipidemia, hypothyroidism, pneumonia, pulmonary nodule. Sleep apnea, Vitamin D deficiency, adenomatous colonic polyps, fluid retention.  History of dependent edema? Not taking meds  History of hyperlipidemia treated with Vascepa 1 g two capsules twice daily, Crestor 5 mg daily.  History of hypothyroidism treated with Synthroid  75 mcg daily.   History of herpes simplex treated with Valtrex 1000 mg 2 tablets at onset of symptoms and again in 12 hours.     Past Medical History:  Diagnosis Date   Allergy    Anemia    Blood donor - stopped due to iron deficiency 02/05/2022   Fluid retention    GERD (gastroesophageal reflux disease)    History of snoring    Hx of adenomatous colonic polyps 03/13/2016   Hyperlipidemia    Hypothyroid    Pneumonia    Pulmonary nodule    Sleep apnea    Vitamin D deficiency      Family History  Problem Relation Age of Onset   Ovarian cancer Mother    Heart disease Father    Hyperlipidemia Father    Hypertension Father    Diabetes Father    Heart attack Brother    Diabetes Brother    Hypertension Brother    Hyperlipidemia Brother    Heart disease Brother    Colon polyps Neg Hx    Colon cancer Neg Hx    Esophageal cancer Neg Hx    Stomach cancer Neg Hx    Rectal cancer Neg Hx     Social History   Social History Narrative   Social history: Married.  2 daughters.  Husband is a Therapist, sports carrier.  She completed 2 years of college.  Works in [employer] with [employer].  She does not smoke.  Social alcohol consumption.       Family history: Brother died of an acute MI.   Father with history of diabetes, heart disease, hyperlipidemia, MI, hypertension.  Paternal uncle with MI.  2 brothers with hypertension, diabetes, hyperlipidemia.  2 sisters.      ROS      Objective:   Vitals: There were no vitals taken for this visit.   Physical Exam    Results:   Studies obtained and personally reviewed by me:  Imaging, colonoscopy, mammogram, bone density scan, echocardiogram, heart cath, stress test, CT calcium score, etc. ***   Labs:       Component Value Date/Time   NA 142 11/28/2022 0949   NA 141 03/27/2021 0000   K 4.8 11/28/2022 0949   CL 105 11/28/2022 0949   CO2 25 11/28/2022 0949   GLUCOSE 103 (H) 11/28/2022 0949   BUN 17 11/28/2022 0949   BUN 10 03/27/2021 0000   CREATININE 0.73 11/28/2022 0949   CALCIUM 9.7 11/28/2022 0949   PROT 6.8 11/28/2022 0949   ALBUMIN 4.6 03/27/2021 0000   AST 30 11/28/2022 0949   ALT 46 (H) 11/28/2022 0949   ALKPHOS 107 03/27/2021 0000   BILITOT 0.6 11/28/2022 0949     Lab Results  Component Value Date   WBC 4.7 11/28/2022  HGB 14.2 11/28/2022   HCT 42.5 11/28/2022   MCV 85.5 11/28/2022   PLT 264 11/28/2022    Lab Results  Component Value Date   CHOL 140 11/28/2022   HDL 40 (L) 11/28/2022   LDLCALC 74 11/28/2022   TRIG 166 (H) 11/28/2022   CHOLHDL 3.5 11/28/2022    Lab Results  Component Value Date   HGBA1C 5.5 11/28/2022     Lab Results  Component Value Date   TSH 1.10 11/28/2022     No results found for: "PSA1", "PSA" *** delete for female pts  ***    Assessment & Plan:   ***    I,Alexander Ruley,acting as a scribe for Elby Showers, MD.,have documented all relevant documentation on the behalf of Elby Showers, MD,as directed by  Elby Showers, MD while in the presence of Elby Showers, MD.   ***

## 2022-12-30 ENCOUNTER — Other Ambulatory Visit: Payer: Federal, State, Local not specified - PPO

## 2022-12-30 ENCOUNTER — Other Ambulatory Visit (HOSPITAL_BASED_OUTPATIENT_CLINIC_OR_DEPARTMENT_OTHER): Payer: Self-pay

## 2022-12-30 DIAGNOSIS — R7989 Other specified abnormal findings of blood chemistry: Secondary | ICD-10-CM | POA: Diagnosis not present

## 2022-12-30 MED ORDER — BOOSTRIX 5-2.5-18.5 LF-MCG/0.5 IM SUSY
PREFILLED_SYRINGE | INTRAMUSCULAR | 0 refills | Status: DC
  Filled 2022-12-30: qty 0.5, 1d supply, fill #0

## 2022-12-31 ENCOUNTER — Telehealth: Payer: Self-pay

## 2022-12-31 LAB — HEPATIC FUNCTION PANEL
AG Ratio: 1.7 (calc) (ref 1.0–2.5)
ALT: 19 U/L (ref 6–29)
AST: 19 U/L (ref 10–35)
Albumin: 4.2 g/dL (ref 3.6–5.1)
Alkaline phosphatase (APISO): 82 U/L (ref 37–153)
Bilirubin, Direct: 0.1 mg/dL (ref 0.0–0.2)
Globulin: 2.5 g/dL (calc) (ref 1.9–3.7)
Indirect Bilirubin: 0.4 mg/dL (calc) (ref 0.2–1.2)
Total Bilirubin: 0.5 mg/dL (ref 0.2–1.2)
Total Protein: 6.7 g/dL (ref 6.1–8.1)

## 2022-12-31 NOTE — Telephone Encounter (Signed)
Patient informed of lab results and decided to cancelled Ov because appt was based on the results.  Patient has upcoming appt in July

## 2023-01-02 ENCOUNTER — Ambulatory Visit: Payer: Federal, State, Local not specified - PPO | Admitting: Internal Medicine

## 2023-02-03 ENCOUNTER — Other Ambulatory Visit: Payer: Self-pay | Admitting: Internal Medicine

## 2023-02-04 ENCOUNTER — Other Ambulatory Visit: Payer: Self-pay | Admitting: Internal Medicine

## 2023-02-17 ENCOUNTER — Telehealth: Payer: Self-pay | Admitting: Internal Medicine

## 2023-02-17 ENCOUNTER — Encounter: Payer: Self-pay | Admitting: Internal Medicine

## 2023-02-17 ENCOUNTER — Ambulatory Visit: Payer: Federal, State, Local not specified - PPO | Admitting: Internal Medicine

## 2023-02-17 VITALS — BP 132/84 | HR 75 | Temp 98.5°F | Ht 66.0 in | Wt 213.4 lb

## 2023-02-17 DIAGNOSIS — R1084 Generalized abdominal pain: Secondary | ICD-10-CM | POA: Diagnosis not present

## 2023-02-17 LAB — POCT URINALYSIS DIPSTICK
Bilirubin, UA: NEGATIVE
Glucose, UA: NEGATIVE
Ketones, UA: NEGATIVE
Leukocytes, UA: NEGATIVE
Nitrite, UA: NEGATIVE
Protein, UA: NEGATIVE
Spec Grav, UA: 1.015 (ref 1.010–1.025)
Urobilinogen, UA: 1 E.U./dL
pH, UA: 5 (ref 5.0–8.0)

## 2023-02-17 MED ORDER — CIPROFLOXACIN HCL 500 MG PO TABS: 500.0000 mg | ORAL_TABLET | Freq: Two times a day (BID) | ORAL | 0 refills | Status: AC

## 2023-02-17 NOTE — Telephone Encounter (Signed)
Lemma Trainer [phone]  Nancy Lawrence says she has had lower abdominal pain for 4 days after eating [place], she said she would like to come in and see if she has UTI, no symptoms just this abdomen pain and tenderness. Going out of town later this week. No nausea or diarrhea.

## 2023-02-17 NOTE — Progress Notes (Signed)
Patient Care Team: Margaree Mackintosh, MD as PCP - General (Internal Medicine) Lewayne Bunting, MD as PCP - Cardiology (Cardiology)  Visit Date: 02/17/23  Subjective:    Patient ID: Nancy Lawrence , Female   DOB: [birth year], 62 y.o.    MRN: [id number]   62 y.o. Female presents today for lower abdominal pain since 4/5 after having [place] food. Started with sharp abdominal pain. Taking GasX, Tums, ibuprofen. Pain is worse when walking. Denies vomiting, diarrhea, fever, chills. Reports normal daily bowel movements. Pain has been moving around in her abdomen, not one-sided.  Past Medical History:  Diagnosis Date   Allergy    Anemia    Blood donor - stopped due to iron deficiency 02/05/2022   Fluid retention    GERD (gastroesophageal reflux disease)    History of snoring    Hx of adenomatous colonic polyps 03/13/2016   Hyperlipidemia    Hypothyroid    Pneumonia    Pulmonary nodule    Sleep apnea    Vitamin D deficiency      Family History  Problem Relation Age of Onset   Ovarian cancer Mother    Heart disease Father    Hyperlipidemia Father    Hypertension Father    Diabetes Father    Heart attack Brother    Diabetes Brother    Hypertension Brother    Hyperlipidemia Brother    Heart disease Brother    Colon polyps Neg Hx    Colon cancer Neg Hx    Esophageal cancer Neg Hx    Stomach cancer Neg Hx    Rectal cancer Neg Hx     Social History   Social History Narrative   Social history: Married.  2 daughters.  Husband is a Fish farm manager carrier.  She completed 2 years of college.  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  She does not smoke.  Social alcohol consumption.       Family history: Brother died of an acute MI.  Father with history of diabetes, heart disease, hyperlipidemia, MI, hypertension.  Paternal uncle with MI.  2 brothers with hypertension, diabetes, hyperlipidemia.  2 sisters.      Review of Systems  Constitutional:  Negative for chills, fever and  malaise/fatigue.  HENT:  Negative for congestion.   Eyes:  Negative for blurred vision.  Respiratory:  Negative for cough and shortness of breath.   Cardiovascular:  Negative for chest pain, palpitations and leg swelling.  Gastrointestinal:  Positive for abdominal pain (Lower). Negative for diarrhea and vomiting.  Musculoskeletal:  Negative for back pain.  Skin:  Negative for rash.  Neurological:  Negative for loss of consciousness and headaches.        Objective:   Vitals: BP 132/84   Pulse 75   Temp 98.5 F (36.9 C) (Tympanic)   Ht 5\' 6"  (1.676 m)   Wt 213 lb 6.4 oz (96.8 kg)   SpO2 98%   BMI 34.44 kg/m    Physical Exam Vitals and nursing note reviewed.  Constitutional:      General: She is not in acute distress.    Appearance: Normal appearance. She is not toxic-appearing.  HENT:     Head: Normocephalic and atraumatic.  Pulmonary:     Effort: Pulmonary effort is normal.  Abdominal:     General: There is no distension.     Palpations: Abdomen is soft. There is no hepatomegaly or splenomegaly.     Tenderness: There is abdominal tenderness. There  is no rebound.     Comments: Very mild tenderness to palpation without rebound.  Skin:    General: Skin is warm and dry.  Neurological:     Mental Status: She is alert and oriented to person, place, and time. Mental status is at baseline.  Psychiatric:        Mood and Affect: Mood normal.        Behavior: Behavior normal.        Thought Content: Thought content normal.        Judgment: Judgment normal.       Results:   Studies obtained and personally reviewed by me:   Labs:       Component Value Date/Time   NA 142 11/28/2022 0949   NA 141 03/27/2021 0000   K 4.8 11/28/2022 0949   CL 105 11/28/2022 0949   CO2 25 11/28/2022 0949   GLUCOSE 103 (H) 11/28/2022 0949   BUN 17 11/28/2022 0949   BUN 10 03/27/2021 0000   CREATININE 0.73 11/28/2022 0949   CALCIUM 9.7 11/28/2022 0949   PROT 6.7 12/30/2022 1000    ALBUMIN 4.6 03/27/2021 0000   AST 19 12/30/2022 1000   ALT 19 12/30/2022 1000   ALKPHOS 107 03/27/2021 0000   BILITOT 0.5 12/30/2022 1000     Lab Results  Component Value Date   WBC 4.7 11/28/2022   HGB 14.2 11/28/2022   HCT 42.5 11/28/2022   MCV 85.5 11/28/2022   PLT 264 11/28/2022    Lab Results  Component Value Date   CHOL 140 11/28/2022   HDL 40 (L) 11/28/2022   LDLCALC 74 11/28/2022   TRIG 166 (H) 11/28/2022   CHOLHDL 3.5 11/28/2022    Lab Results  Component Value Date   HGBA1C 5.5 11/28/2022     Lab Results  Component Value Date   TSH 1.10 11/28/2022      Assessment & Plan:   Generalized abdominal pain: Etiology unclear. Prescribed Cipro 500 mg twice daily for 10 days.This should treat food borne illness  or early cystitis. Ordered CBC with Diff/Plat. Ordered abdominal X-ray. Urine did not show signs of infection or hematuria. Stay well-hydrated. Advance diet slowly. If symptoms worsen, consider CT scan.Urine dipstick is normal. WBC is normal. Abdominal Xray nonspecific bowel gas pattern.  I,Alexander Ruley,acting as a Neurosurgeonscribe for Margaree MackintoshMary J Tashona Calk, MD.,have documented all relevant documentation on the behalf of Margaree MackintoshMary J Anival Pasha, MD,as directed by  Margaree MackintoshMary J Dajuana Palen, MD while in the presence of Margaree MackintoshMary J Arita Severtson, MD.   I, Margaree MackintoshMary J Rilee Wendling, MD, have reviewed all documentation for this visit. The documentation on 03/05/23 for the exam, diagnosis, procedures, and orders are all accurate and complete.

## 2023-02-17 NOTE — Telephone Encounter (Signed)
scheduled

## 2023-02-17 NOTE — Patient Instructions (Signed)
Urine does not show significant hematuria or pyuria. Will order KUB. Abdominal tenderness is mild. Recommend advancing diet slowly. Avoid milk products for a few days. CBC with diff drawn. Cipro 500 mg twice daily x 7days.

## 2023-02-18 ENCOUNTER — Ambulatory Visit
Admission: RE | Admit: 2023-02-18 | Discharge: 2023-02-18 | Disposition: A | Discharge status: Home / Self Care | Payer: Federal, State, Local not specified - PPO | Source: Ambulatory Visit | Attending: Internal Medicine | Admitting: Internal Medicine

## 2023-02-18 DIAGNOSIS — R109 Unspecified abdominal pain: Secondary | ICD-10-CM | POA: Diagnosis not present

## 2023-02-18 LAB — CBC WITH DIFFERENTIAL/PLATELET
Absolute Monocytes: 689 cells/uL (ref 200–950)
Basophils Absolute: 33 cells/uL (ref 0–200)
Basophils Relative: 0.4 %
Eosinophils Absolute: 148 cells/uL (ref 15–500)
Eosinophils Relative: 1.8 %
HCT: 41.7 % (ref 35.0–45.0)
Hemoglobin: 14.1 g/dL (ref 11.7–15.5)
Lymphs Abs: 1574 cells/uL (ref 850–3900)
MCH: 27.6 pg (ref 27.0–33.0)
MCHC: 33.8 g/dL (ref 32.0–36.0)
MCV: 81.8 fL (ref 80.0–100.0)
MPV: 9.6 fL (ref 7.5–12.5)
Monocytes Relative: 8.4 %
Neutro Abs: 5756 cells/uL (ref 1500–7800)
Neutrophils Relative %: 70.2 %
Platelets: 277 10*3/uL (ref 140–400)
RBC: 5.1 10*6/uL (ref 3.80–5.10)
RDW: 13.5 % (ref 11.0–15.0)
Total Lymphocyte: 19.2 %
WBC: 8.2 10*3/uL (ref 3.8–10.8)

## 2023-04-20 DIAGNOSIS — J019 Acute sinusitis, unspecified: Secondary | ICD-10-CM | POA: Diagnosis not present

## 2023-04-20 DIAGNOSIS — J209 Acute bronchitis, unspecified: Secondary | ICD-10-CM | POA: Diagnosis not present

## 2023-05-30 ENCOUNTER — Other Ambulatory Visit: Payer: Self-pay | Admitting: Internal Medicine

## 2023-05-31 ENCOUNTER — Other Ambulatory Visit: Payer: Self-pay | Admitting: Internal Medicine

## 2023-06-03 ENCOUNTER — Other Ambulatory Visit: Payer: Federal, State, Local not specified - PPO

## 2023-06-05 ENCOUNTER — Other Ambulatory Visit: Payer: Federal, State, Local not specified - PPO

## 2023-06-05 ENCOUNTER — Ambulatory Visit: Payer: Federal, State, Local not specified - PPO | Admitting: Internal Medicine

## 2023-06-05 DIAGNOSIS — E782 Mixed hyperlipidemia: Secondary | ICD-10-CM

## 2023-06-05 DIAGNOSIS — E039 Hypothyroidism, unspecified: Secondary | ICD-10-CM | POA: Diagnosis not present

## 2023-06-06 ENCOUNTER — Encounter: Payer: Self-pay | Admitting: Internal Medicine

## 2023-06-06 ENCOUNTER — Ambulatory Visit (INDEPENDENT_AMBULATORY_CARE_PROVIDER_SITE_OTHER): Payer: Federal, State, Local not specified - PPO | Admitting: Internal Medicine

## 2023-06-06 ENCOUNTER — Telehealth: Payer: Self-pay | Admitting: Internal Medicine

## 2023-06-06 VITALS — BP 110/80 | HR 68 | Temp 98.5°F | Ht 66.0 in | Wt 219.0 lb

## 2023-06-06 DIAGNOSIS — L989 Disorder of the skin and subcutaneous tissue, unspecified: Secondary | ICD-10-CM | POA: Insufficient documentation

## 2023-06-06 DIAGNOSIS — D649 Anemia, unspecified: Secondary | ICD-10-CM | POA: Insufficient documentation

## 2023-06-06 DIAGNOSIS — L409 Psoriasis, unspecified: Secondary | ICD-10-CM | POA: Insufficient documentation

## 2023-06-06 DIAGNOSIS — L02214 Cutaneous abscess of groin: Secondary | ICD-10-CM | POA: Insufficient documentation

## 2023-06-06 DIAGNOSIS — L309 Dermatitis, unspecified: Secondary | ICD-10-CM | POA: Insufficient documentation

## 2023-06-06 DIAGNOSIS — L259 Unspecified contact dermatitis, unspecified cause: Secondary | ICD-10-CM | POA: Insufficient documentation

## 2023-06-06 DIAGNOSIS — M791 Myalgia, unspecified site: Secondary | ICD-10-CM | POA: Insufficient documentation

## 2023-06-06 DIAGNOSIS — Z6835 Body mass index (BMI) 35.0-35.9, adult: Secondary | ICD-10-CM

## 2023-06-06 DIAGNOSIS — E782 Mixed hyperlipidemia: Secondary | ICD-10-CM

## 2023-06-06 DIAGNOSIS — M62830 Muscle spasm of back: Secondary | ICD-10-CM | POA: Insufficient documentation

## 2023-06-06 DIAGNOSIS — M609 Myositis, unspecified: Secondary | ICD-10-CM | POA: Insufficient documentation

## 2023-06-06 DIAGNOSIS — E039 Hypothyroidism, unspecified: Secondary | ICD-10-CM | POA: Insufficient documentation

## 2023-06-06 DIAGNOSIS — L03119 Cellulitis of unspecified part of limb: Secondary | ICD-10-CM | POA: Insufficient documentation

## 2023-06-06 DIAGNOSIS — N841 Polyp of cervix uteri: Secondary | ICD-10-CM | POA: Insufficient documentation

## 2023-06-06 DIAGNOSIS — Z833 Family history of diabetes mellitus: Secondary | ICD-10-CM | POA: Insufficient documentation

## 2023-06-06 LAB — TSH: TSH: 0.76 mIU/L (ref 0.40–4.50)

## 2023-06-06 MED ORDER — ROSUVASTATIN CALCIUM 10 MG PO TABS
10.0000 mg | ORAL_TABLET | Freq: Every day | ORAL | 3 refills | Status: DC
Start: 2023-06-06 — End: 2023-12-05

## 2023-06-06 MED ORDER — PHENTERMINE HCL 15 MG PO CAPS
15.0000 mg | ORAL_CAPSULE | ORAL | 0 refills | Status: DC
Start: 2023-06-06 — End: 2023-12-05

## 2023-06-06 NOTE — Progress Notes (Signed)
Patient Care Team: Margaree Mackintosh, MD as PCP - General (Internal Medicine) Lewayne Bunting, MD as PCP - Cardiology (Cardiology)  Visit Date: 06/06/23  Subjective:    Patient ID: Nancy Lawrence , Female   DOB: 01-Nov-1961, 62 y.o.    MRN: [id number]   62 y.o. Female presents today for 6 month follow up.   Lately Nancy Lawrence has been struggling with stress and wanting to work on weight loss. Nancy Lawrence is caring for her father who has dementia, as well as her grandchildren sometimes. Nancy Lawrence states that Nancy Lawrence is "up and down" with dieting and is concerned about stress eating. In the past Nancy Lawrence has been intolerant of Saxenda due to nausea.  A couple weeks ago Nancy Lawrence developed some swelling in her ankles. This was in the setting of being outside in the heat at the beach. Nancy Lawrence had her Lasix on hand which did work to improve her swelling. Previously Nancy Lawrence hadn't needed to take Lasix in years.   History of hyperlipidemia, hypertriglyceridemia. Lipid panel on 11/28/2022 showed a total cholesterol of 140, HDL of 40, triglycerides of 166, and LDL of 74. Repeat lipids 06/05/2023 showed total cholesterol 187, HDL 40, triglycerides 230, LDL 112. Nancy Lawrence states that Nancy Lawrence was dieting more in January. Nancy Lawrence had a coronary CT with calcium total score of 11 in 2019.   Hypothyroid: Nancy Lawrence reports compliance with Synthroid daily. Her last TSH was 0.76 on 06/05/2023.  History of postmenopausal bleeding in 10/2022. Nancy Lawrence then had a polypectomy and endometrial biopsy which were negative. Her mother passed away Aug 26, 2022 from ovarian cancer.   Health maintenance: Mammogram was completed 12/16/2022 which was negative. Nancy Lawrence is UTD on her colonoscopy, last was 01/2022 and advised to repeat in 2030.   Anemia: Her last CBC was within normal limits on 02/17/2023.   GERD: Her symptoms have been well controlled with Omeprazole 20mg  daily.   Allergies: Her symptoms have been well controlled with Zyrtec 10mg  daily.   Insomnia: Nancy Lawrence states that Nancy Lawrence has had  difficulty sleeping since her mother passed. Nancy Lawrence was taking Ibuprofen PM and Melatonin with minimal relief. Nancy Lawrence is now taking Xanax 5mg  every night.  Past Medical History:  Diagnosis Date   Allergy    Anemia    Blood donor - stopped due to iron deficiency 02/05/2022   Fluid retention    GERD (gastroesophageal reflux disease)    History of snoring    Hx of adenomatous colonic polyps 03/13/2016   Hyperlipidemia    Hypothyroid    Pneumonia    Pulmonary nodule    Sleep apnea    Vitamin D deficiency      Family History  Problem Relation Age of Onset   Ovarian cancer Mother    Heart disease Father    Hyperlipidemia Father    Hypertension Father    Diabetes Father    Heart attack Brother    Diabetes Brother    Hypertension Brother    Hyperlipidemia Brother    Heart disease Brother    Colon polyps Neg Hx    Colon cancer Neg Hx    Esophageal cancer Neg Hx    Stomach cancer Neg Hx    Rectal cancer Neg Hx     Social History   Social History Narrative   Social history: Married.  2 daughters.  Husband is a Fish farm manager carrier.  Nancy Lawrence completed 2 years of college.  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  Nancy Lawrence does not smoke.  Social alcohol consumption.  Family history: Brother died of an acute MI.  Father with history of diabetes, heart disease, hyperlipidemia, MI, hypertension.  Paternal uncle with MI.  2 brothers with hypertension, diabetes, hyperlipidemia.  2 sisters.      Review of Systems  Constitutional:  Negative for fever and malaise/fatigue.  HENT:  Negative for congestion.   Eyes:  Negative for blurred vision.  Respiratory:  Negative for cough and shortness of breath.   Cardiovascular:  Negative for chest pain, palpitations and leg swelling.  Gastrointestinal:  Negative for vomiting.  Musculoskeletal:  Negative for back pain.  Skin:  Negative for rash.  Neurological:  Negative for loss of consciousness and headaches.  Psychiatric/Behavioral:         (+) Stress         Objective:   Vitals: BP 110/80 (BP Location: Left Arm, Patient Position: Sitting, Cuff Size: Normal)   Pulse 68   Temp 98.5 F (36.9 C) (Oral)   Ht 5\' 6"  (1.676 m)   Wt 219 lb (99.3 kg)   SpO2 94%   BMI 35.35 kg/m    Physical Exam Vitals and nursing note reviewed.  Constitutional:      General: Nancy Lawrence is not in acute distress.    Appearance: Normal appearance. Nancy Lawrence is not toxic-appearing.  HENT:     Head: Normocephalic and atraumatic.  Cardiovascular:     Rate and Rhythm: Normal rate and regular rhythm. No extrasystoles are present.    Pulses: Normal pulses.     Heart sounds: Normal heart sounds. No murmur heard.    No friction rub. No gallop.  Pulmonary:     Effort: Pulmonary effort is normal. No respiratory distress.     Breath sounds: Normal breath sounds. No wheezing or rales.  Musculoskeletal:     Right lower leg: No edema.     Left lower leg: No edema.  Skin:    General: Skin is warm and dry.  Neurological:     Mental Status: Nancy Lawrence is alert and oriented to person, place, and time. Mental status is at baseline.  Psychiatric:        Mood and Affect: Mood normal.        Behavior: Behavior normal.        Thought Content: Thought content normal.        Judgment: Judgment normal.       Results:   Studies obtained and personally reviewed by me:  Mammogram was completed 12/16/2022 which was negative.   Nancy Lawrence had her last colonoscopy 01/2022 with recall in 2030.  Labs:       Component Value Date/Time   NA 142 11/28/2022 0949   NA 141 03/27/2021 0000   K 4.8 11/28/2022 0949   CL 105 11/28/2022 0949   CO2 25 11/28/2022 0949   GLUCOSE 103 (H) 11/28/2022 0949   BUN 17 11/28/2022 0949   BUN 10 03/27/2021 0000   CREATININE 0.73 11/28/2022 0949   CALCIUM 9.7 11/28/2022 0949   PROT 6.7 12/30/2022 1000   ALBUMIN 4.6 03/27/2021 0000   AST 19 12/30/2022 1000   ALT 19 12/30/2022 1000   ALKPHOS 107 03/27/2021 0000   BILITOT 0.5 12/30/2022 1000     Lab  Results  Component Value Date   WBC 8.2 02/17/2023   HGB 14.1 02/17/2023   HCT 41.7 02/17/2023   MCV 81.8 02/17/2023   PLT 277 02/17/2023    Lab Results  Component Value Date   CHOL 187 06/05/2023   HDL 40 (L)  06/05/2023   LDLCALC 112 (H) 06/05/2023   TRIG 230 (H) 06/05/2023   CHOLHDL 4.7 06/05/2023    Lab Results  Component Value Date   HGBA1C 5.5 11/28/2022     Lab Results  Component Value Date   TSH 0.76 06/05/2023      Assessment & Plan:   Hyperlipidemia and hypertriglyceridemia: CT calcium score of 11 in 2019. We increased her Crestor to 10 mg daily 11/2022, but this was subsequently reduced to 5 mg due to an episode of severe muscle pain. Lipid panel 06/05/23 with total cholesterol 187, HDL 40, triglycerides 230, LDL 112. Today we will increase her rosuvastatin to 10 mg daily.   Nancy Lawrence also wishes to work on weight loss. Nancy Lawrence admits to stress eating. In the past Nancy Lawrence has been intolerant of Saxenda due to nausea. Will prescribe phentermine 15 mg every morning. Also discussed making an appointment with the weight loss clinic at [facility] or [place].  History of dependent edema - Nancy Lawrence had a recent episode of edema in her bilateral ankles in the setting of being outside in the hot weather at the beach. Resolved with prn Lasix.  Hypothyroidism: Well controlled on Synthroid daily.   History of Elevated LFTs: ALT was 46 as of 11/2022. Advised to discontinue use of NSAIDs at that time. Repeat hepatic function panel 12/30/2022 was normal.     History of Postmenopausal bleeding: Nancy Lawrence is being followed by OB for this. Nancy Lawrence had a negative endometrial biopsy and polypectomy.   Labs from 06/05/2023 reviewed with patient:  TSH 0.76. Lipid panel showed total cholesterol 187, HDL 40, triglycerides 230, LDL 112.  Vaccine counseling: Nancy Lawrence received her flu vaccine 12/02/2022. Recommended that Nancy Lawrence receive the Zoster vaccinations. Tdap vaccination was received 12/30/2022.    Health maintenance:   Mammogram was completed 12/16/2022 which was negative. PAP smear last completed 09/2020 and was negative. Nancy Lawrence had her last colonoscopy 01/2022 with recall in 2030.   Plan: Increase rosuvastatin to 10 mg daily. Prescribe phentermine 15 mg once daily every morning.   I,Mathew Stumpf,acting as a Neurosurgeon for Margaree Mackintosh, MD.,have documented all relevant documentation on the behalf of Margaree Mackintosh, MD,as directed by  Margaree Mackintosh, MD while in the presence of Margaree Mackintosh, MD.   I, Margaree Mackintosh, MD, have reviewed all documentation for this visit. The documentation on 06/06/23 for the exam, diagnosis, procedures, and orders are all accurate and complete.

## 2023-06-06 NOTE — Telephone Encounter (Signed)
Nancy Lawrence called back to say she had verified with the pharmacy that still been taking the 5 mg of the Crestor. So she would be starting the 10 mg today.

## 2023-06-06 NOTE — Patient Instructions (Addendum)
Phentermine 15 mg (#30) 1 p.o. every morning for appetite suppressant.  Vaccines reviewed and discussed.  Patient interested in weight loss options.  May want to look into [facility] as would likely do well with that program.  TSH is stable on thyroid replacement medication.  Triglycerides are elevated but patient has had a lot of situational stress.  I think we should increase Crestor to 10 mg daily and follow-up in January.

## 2023-06-08 ENCOUNTER — Other Ambulatory Visit: Payer: Self-pay | Admitting: Internal Medicine

## 2023-06-13 ENCOUNTER — Other Ambulatory Visit (HOSPITAL_BASED_OUTPATIENT_CLINIC_OR_DEPARTMENT_OTHER): Payer: Self-pay

## 2023-06-13 MED ORDER — ZOSTER VAC RECOMB ADJUVANTED 50 MCG/0.5ML IM SUSR
0.5000 mL | Freq: Once | INTRAMUSCULAR | 0 refills | Status: AC
  Filled 2023-06-13: qty 0.5, 1d supply, fill #0

## 2023-06-30 ENCOUNTER — Other Ambulatory Visit (HOSPITAL_COMMUNITY): Payer: Self-pay

## 2023-09-02 ENCOUNTER — Other Ambulatory Visit: Payer: Self-pay | Admitting: Internal Medicine

## 2023-09-09 ENCOUNTER — Other Ambulatory Visit (HOSPITAL_BASED_OUTPATIENT_CLINIC_OR_DEPARTMENT_OTHER): Payer: Self-pay

## 2023-09-09 ENCOUNTER — Other Ambulatory Visit: Payer: Federal, State, Local not specified - PPO

## 2023-09-09 DIAGNOSIS — E782 Mixed hyperlipidemia: Secondary | ICD-10-CM | POA: Diagnosis not present

## 2023-09-09 DIAGNOSIS — E039 Hypothyroidism, unspecified: Secondary | ICD-10-CM | POA: Diagnosis not present

## 2023-09-09 MED ORDER — FLULAVAL 0.5 ML IM SUSY
0.5000 mL | PREFILLED_SYRINGE | Freq: Once | INTRAMUSCULAR | 0 refills | Status: AC
  Filled 2023-09-09: qty 0.5, 1d supply, fill #0

## 2023-09-10 LAB — HEPATIC FUNCTION PANEL
AG Ratio: 1.9 (calc) (ref 1.0–2.5)
ALT: 24 U/L (ref 6–29)
AST: 24 U/L (ref 10–35)
Albumin: 4.4 g/dL (ref 3.6–5.1)
Alkaline phosphatase (APISO): 87 U/L (ref 37–153)
Bilirubin, Direct: 0.1 mg/dL (ref 0.0–0.2)
Globulin: 2.3 g/dL (ref 1.9–3.7)
Indirect Bilirubin: 0.4 mg/dL (ref 0.2–1.2)
Total Bilirubin: 0.5 mg/dL (ref 0.2–1.2)
Total Protein: 6.7 g/dL (ref 6.1–8.1)

## 2023-09-10 LAB — LIPID PANEL
Cholesterol: 182 mg/dL (ref <200)
HDL: 40 mg/dL — ABNORMAL LOW (ref >=50)
LDL Cholesterol (Calc): 101 mg/dL — ABNORMAL HIGH
Non-HDL Cholesterol (Calc): 142 mg/dL — ABNORMAL HIGH (ref <130)
Total CHOL/HDL Ratio: 4.6 (calc) (ref <5.0)
Triglycerides: 284 mg/dL — ABNORMAL HIGH (ref <150)

## 2023-09-10 LAB — TSH: TSH: 1.12 m[IU]/L (ref 0.40–4.50)

## 2023-09-13 DIAGNOSIS — M7712 Lateral epicondylitis, left elbow: Secondary | ICD-10-CM | POA: Diagnosis not present

## 2023-09-13 DIAGNOSIS — J988 Other specified respiratory disorders: Secondary | ICD-10-CM | POA: Diagnosis not present

## 2023-09-13 DIAGNOSIS — R051 Acute cough: Secondary | ICD-10-CM | POA: Diagnosis not present

## 2023-09-13 DIAGNOSIS — R062 Wheezing: Secondary | ICD-10-CM | POA: Diagnosis not present

## 2023-09-19 DIAGNOSIS — R059 Cough, unspecified: Secondary | ICD-10-CM | POA: Diagnosis not present

## 2023-11-22 DIAGNOSIS — J0101 Acute recurrent maxillary sinusitis: Secondary | ICD-10-CM | POA: Diagnosis not present

## 2023-11-27 ENCOUNTER — Other Ambulatory Visit: Payer: BC Managed Care – PPO

## 2023-11-27 DIAGNOSIS — E611 Iron deficiency: Secondary | ICD-10-CM

## 2023-11-27 DIAGNOSIS — E782 Mixed hyperlipidemia: Secondary | ICD-10-CM | POA: Diagnosis not present

## 2023-11-27 DIAGNOSIS — R7302 Impaired glucose tolerance (oral): Secondary | ICD-10-CM | POA: Diagnosis not present

## 2023-11-27 DIAGNOSIS — G4733 Obstructive sleep apnea (adult) (pediatric): Secondary | ICD-10-CM

## 2023-11-27 DIAGNOSIS — Z0000 Encounter for general adult medical examination without abnormal findings: Secondary | ICD-10-CM

## 2023-11-27 DIAGNOSIS — K219 Gastro-esophageal reflux disease without esophagitis: Secondary | ICD-10-CM

## 2023-11-27 DIAGNOSIS — R1084 Generalized abdominal pain: Secondary | ICD-10-CM | POA: Diagnosis not present

## 2023-11-27 DIAGNOSIS — E039 Hypothyroidism, unspecified: Secondary | ICD-10-CM

## 2023-11-28 LAB — CBC WITH DIFFERENTIAL/PLATELET
Absolute Lymphocytes: 1287 {cells}/uL (ref 850–3900)
Absolute Monocytes: 413 {cells}/uL (ref 200–950)
Basophils Absolute: 50 {cells}/uL (ref 0–200)
Basophils Relative: 0.9 %
Eosinophils Absolute: 391 {cells}/uL (ref 15–500)
Eosinophils Relative: 7.1 %
HCT: 46.4 % — ABNORMAL HIGH (ref 35.0–45.0)
Hemoglobin: 15.5 g/dL (ref 11.7–15.5)
MCH: 28.9 pg (ref 27.0–33.0)
MCHC: 33.4 g/dL (ref 32.0–36.0)
MCV: 86.4 fL (ref 80.0–100.0)
MPV: 9.4 fL (ref 7.5–12.5)
Monocytes Relative: 7.5 %
Neutro Abs: 3361 {cells}/uL (ref 1500–7800)
Neutrophils Relative %: 61.1 %
Platelets: 283 10*3/uL (ref 140–400)
RBC: 5.37 10*6/uL — ABNORMAL HIGH (ref 3.80–5.10)
RDW: 13.9 % (ref 11.0–15.0)
Total Lymphocyte: 23.4 %
WBC: 5.5 10*3/uL (ref 3.8–10.8)

## 2023-11-28 LAB — COMPLETE METABOLIC PANEL WITH GFR
AG Ratio: 2.2 (calc) (ref 1.0–2.5)
ALT: 22 U/L (ref 6–29)
AST: 19 U/L (ref 10–35)
Albumin: 4.8 g/dL (ref 3.6–5.1)
Alkaline phosphatase (APISO): 83 U/L (ref 37–153)
BUN: 15 mg/dL (ref 7–25)
CO2: 27 mmol/L (ref 20–32)
Calcium: 9.8 mg/dL (ref 8.6–10.4)
Chloride: 103 mmol/L (ref 98–110)
Creat: 0.77 mg/dL (ref 0.50–1.05)
Globulin: 2.2 g/dL (ref 1.9–3.7)
Glucose, Bld: 98 mg/dL (ref 65–99)
Potassium: 4.6 mmol/L (ref 3.5–5.3)
Sodium: 139 mmol/L (ref 135–146)
Total Bilirubin: 0.7 mg/dL (ref 0.2–1.2)
Total Protein: 7 g/dL (ref 6.1–8.1)
eGFR: 87 mL/min/{1.73_m2} (ref >=60)

## 2023-11-28 LAB — LIPID PANEL
Cholesterol: 157 mg/dL (ref <200)
HDL: 41 mg/dL — ABNORMAL LOW (ref >=50)
LDL Cholesterol (Calc): 89 mg/dL
Non-HDL Cholesterol (Calc): 116 mg/dL (ref <130)
Total CHOL/HDL Ratio: 3.8 (calc) (ref <5.0)
Triglycerides: 176 mg/dL — ABNORMAL HIGH (ref <150)

## 2023-11-28 LAB — HEMOGLOBIN A1C
Hgb A1c MFr Bld: 5.5 %{Hb} (ref <5.7)
Mean Plasma Glucose: 111 mg/dL
eAG (mmol/L): 6.2 mmol/L

## 2023-11-28 LAB — TSH: TSH: 0.66 m[IU]/L (ref 0.40–4.50)

## 2023-11-29 ENCOUNTER — Other Ambulatory Visit: Payer: Self-pay | Admitting: Internal Medicine

## 2023-12-05 ENCOUNTER — Encounter: Payer: Self-pay | Admitting: Internal Medicine

## 2023-12-05 ENCOUNTER — Ambulatory Visit: Payer: Federal, State, Local not specified - PPO | Admitting: Internal Medicine

## 2023-12-05 VITALS — BP 110/88 | HR 94 | Ht 66.0 in | Wt 215.0 lb

## 2023-12-05 DIAGNOSIS — G4733 Obstructive sleep apnea (adult) (pediatric): Secondary | ICD-10-CM

## 2023-12-05 DIAGNOSIS — Z860101 Personal history of adenomatous and serrated colon polyps: Secondary | ICD-10-CM

## 2023-12-05 DIAGNOSIS — E039 Hypothyroidism, unspecified: Secondary | ICD-10-CM | POA: Diagnosis not present

## 2023-12-05 DIAGNOSIS — Z8659 Personal history of other mental and behavioral disorders: Secondary | ICD-10-CM | POA: Diagnosis not present

## 2023-12-05 DIAGNOSIS — Z0000 Encounter for general adult medical examination without abnormal findings: Secondary | ICD-10-CM | POA: Diagnosis not present

## 2023-12-05 DIAGNOSIS — E782 Mixed hyperlipidemia: Secondary | ICD-10-CM

## 2023-12-05 DIAGNOSIS — K219 Gastro-esophageal reflux disease without esophagitis: Secondary | ICD-10-CM | POA: Diagnosis not present

## 2023-12-05 DIAGNOSIS — R609 Edema, unspecified: Secondary | ICD-10-CM

## 2023-12-05 DIAGNOSIS — Z8639 Personal history of other endocrine, nutritional and metabolic disease: Secondary | ICD-10-CM

## 2023-12-05 DIAGNOSIS — Z23 Encounter for immunization: Secondary | ICD-10-CM | POA: Diagnosis not present

## 2023-12-05 DIAGNOSIS — F439 Reaction to severe stress, unspecified: Secondary | ICD-10-CM

## 2023-12-05 LAB — POCT URINALYSIS DIP (CLINITEK)
Bilirubin, UA: NEGATIVE
Blood, UA: NEGATIVE
Glucose, UA: NEGATIVE mg/dL
Ketones, POC UA: NEGATIVE mg/dL
Leukocytes, UA: NEGATIVE
Nitrite, UA: NEGATIVE
POC PROTEIN,UA: NEGATIVE
Spec Grav, UA: 1.015 (ref 1.010–1.025)
Urobilinogen, UA: 0.2 U/dL
pH, UA: 6 (ref 5.0–8.0)

## 2023-12-05 MED ORDER — IBUPROFEN 600 MG PO TABS: 600.0000 mg | ORAL_TABLET | Freq: Four times a day (QID) | ORAL | 3 refills | Status: DC | PRN

## 2023-12-05 MED ORDER — ROSUVASTATIN CALCIUM 10 MG PO TABS: 10.0000 mg | ORAL_TABLET | Freq: Every day | ORAL | 3 refills | Status: AC

## 2023-12-05 MED ORDER — PHENTERMINE HCL 15 MG PO CAPS: 15.0000 mg | ORAL_CAPSULE | ORAL | 0 refills | Status: AC

## 2023-12-05 MED ORDER — FUROSEMIDE 40 MG PO TABS: 40.0000 mg | ORAL_TABLET | Freq: Every day | ORAL | 1 refills | Status: DC | PRN

## 2023-12-05 MED ORDER — LEVOTHYROXINE SODIUM 75 MCG PO TABS: 75.0000 ug | ORAL_TABLET | Freq: Every day | ORAL | 0 refills | Status: DC

## 2023-12-05 MED ORDER — ALPRAZOLAM 0.5 MG PO TABS: 0.5000 mg | ORAL_TABLET | Freq: Every day | ORAL | 1 refills | Status: AC

## 2023-12-05 NOTE — Progress Notes (Signed)
Annual Wellness Visit   Patient Care Team: Eliav Mechling, Luanna Cole, MD as PCP - General (Internal Medicine) Lewayne Bunting, MD as PCP - Cardiology (Cardiology)  Visit Date: 12/05/23   Chief Complaint  Patient presents with   Annual Exam   Subjective:  Patient: Nancy Lawrence, Female DOB: [birth year], 63 y.o. MRN: [id number]  Nancy Lawrence is a 63 y.o. Female who presents today for her Annual Wellness Visit. Patient has history of Anxiety/Insomnia, hx of Dependant Edema, HLD, Hypothyroidism, Obesity, and      Reports that she had an URI in November, went to UC, and then at the end of December through January has an Acute Recurrent Maxillary Sinusitis that she is recovered from.   History of Anemia with 12/01/23 CBC, compared to 02/17/23: RBC 5.37, elevated from 5.10; HCT 46.4, elevated from 41.7; otherwise WNL. Has stopped donating blood.    History of GERD treated with with Omeprazole 20 mg daily.   History of Hyperlipidemia treated with Rosuvastatin 5mg  daily and Vascepa 1 gram BID. CT Calcium Total Score of 11 in 2019. Notes that she hasn't been as active due to recent illnesses and inclement weather.    History of Hypothyroidism treated with Levothyroxine daily.     History of Anxiety/Insomnia treated with Xanax 0.5 mg.  History of Obesity managed with 15 mg Phentermine daily. Today's weight is 215, which is a 4 pound decrease since 06/06/23. Notes that it has been difficult to exercise due to recent illnesses and inclement weather.  History of Fever Blisters treated with 1000 mg Valtrex as needed  History of  Musculoskeletal Pain treated with 600 mg Ibuprofen when needed, about 2-3/week. She notes that since she takes care of her grandchildren often she is doing a lot of lifting and holding, causing pain in her elbows.   Labs 11/27/23 CMP: WNL HgbA1c: 5.5 TSH: 0.66  PAP Smear 09/11/2020 was normal with repeat recommended for 2026. Hx of postmenopausal bleeding  December 2023 and a subsequent polypectomy and endometrial biopsy which were negative. Repeat biopsy 11/25/22 was also negative with weakly proliferative to atrophic endometrium noted.   Mammogram 12/16/22 with no significant findings, though breast tissue was notably extremely dense with repeat recommendation of 2025  Bone Density will not be due until 2026  Colonoscopy 02/05/2022 with one flat adenomatous dimunitive polyp removed from ileocecal valve; Diverticulosis in sigmoid colon with repeat recommendation of 2030  Vaccine Counseling: Due for Covid-19 and Shingles 2/2; UTD on Flu and Tdap Past Medical History:  Diagnosis Date   Allergy    Anemia    Blood donor - stopped due to iron deficiency 02/05/2022   Fluid retention    GERD (gastroesophageal reflux disease)    History of snoring    Hx of adenomatous colonic polyps 03/13/2016   Hyperlipidemia    Hypothyroid    Pneumonia    Pulmonary nodule    Sleep apnea    Vitamin D deficiency   Medical/Surgical History Narrative:  CT Chest was repeated in 2020 and opacity was no longer seen.  She was seen by Pulmonologist in December 2020. No further imaging was indicated.   Saw cardiologist 2019 because her brother had sudden cardiac death. She has a CT cardiac score but on that study a ground glass attenuating nodule was noted in the right upper lobe measuring 1.6 cm. Score was 11.   She is allergic to Penicillin and has had possible adverse reaction to Crestor and Celexa. Allergies controlled with  Zyrtec 10mg  daily.  Cholecystectomy 2007. Umbilical hernia repair and lipoma removal around 2000. Pilonidal cyst excised 1994. Pneumonia approximately year 2000. Family History  Problem Relation Age of Onset   Ovarian cancer Mother    Heart disease Father    Hyperlipidemia Father    Hypertension Father    Diabetes Father    Heart attack Brother    Diabetes Brother    Hypertension Brother    Hyperlipidemia Brother    Heart disease Brother     Colon polyps Neg Hx    Colon cancer Neg Hx    Esophageal cancer Neg Hx    Stomach cancer Neg Hx    Rectal cancer Neg Hx   Family History Narrative: Father with history of Diabetes, Heart Disease, Hyperlipidemia, MI, Hypertension, and Dementia Mother passed away in Nov 03, 2023from Ovarian Cancer. 2 brothers with history of Hypertension, Diabetes, Hyperlipidemia; 1 Brother died 04-03-2019aged 15 of an Acute MI.   Paternal Uncle with MI.   Social History   Social History Narrative   Married. 2 daughters. Husband is a Fish farm manager carrier. She completed 2 years of college. 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 She does not smoke. Social alcohol consumption.  11/29/21 - Recently retired from [employer]. Daughter had infant recently needing specialized care at [facility].   12/05/23 - Takes care of her grandchildren whenever possible.           Review of Systems  Constitutional:  Negative for chills, fever, malaise/fatigue and weight loss.  HENT:  Negative for hearing loss, sinus pain and sore throat.   Respiratory:  Negative for cough, hemoptysis and shortness of breath.   Cardiovascular:  Negative for chest pain, palpitations, leg swelling and PND.  Gastrointestinal:  Negative for abdominal pain, constipation, diarrhea, heartburn, nausea and vomiting.  Genitourinary:  Negative for dysuria, frequency and urgency.  Musculoskeletal:  Negative for back pain, myalgias and neck pain.  Skin:  Negative for itching and rash.  Neurological:  Negative for dizziness, tingling, seizures and headaches.  Endo/Heme/Allergies:  Negative for polydipsia.  Psychiatric/Behavioral:  Negative for depression. The patient is not nervous/anxious.     Objective:  Vitals: BP 110/88   Pulse 94   Ht 5\' 6"  (1.676 m)   Wt 215 lb (97.5 kg)   SpO2 95%   BMI 34.70 kg/m  Physical Exam Vitals and nursing note reviewed.  Constitutional:      General: She is not in acute distress.     Appearance: Normal appearance. She is not ill-appearing or toxic-appearing.  HENT:     Head: Normocephalic and atraumatic.     Right Ear: Hearing, tympanic membrane, ear canal and external ear normal.     Left Ear: Hearing, tympanic membrane, ear canal and external ear normal.     Mouth/Throat:     Pharynx: Oropharynx is clear.  Eyes:     Extraocular Movements: Extraocular movements intact.     Pupils: Pupils are equal, round, and reactive to light.  Neck:     Thyroid: No thyroid mass, thyromegaly or thyroid tenderness.     Vascular: No carotid bruit.  Cardiovascular:     Rate and Rhythm: Normal rate and regular rhythm. No extrasystoles are present.    Pulses:          Dorsalis pedis pulses are 1+ on the right side and 1+ on the left side.     Heart sounds: Normal heart sounds. No murmur heard.  No friction rub. No gallop.  Pulmonary:     Effort: Pulmonary effort is normal.     Breath sounds: Normal breath sounds. No decreased breath sounds, wheezing, rhonchi or rales.  Chest:     Chest wall: No mass.  Abdominal:     Palpations: Abdomen is soft. There is no hepatomegaly, splenomegaly or mass.     Tenderness: There is no abdominal tenderness.     Hernia: No hernia is present.  Musculoskeletal:     Cervical back: Normal range of motion.     Right lower leg: No edema.     Left lower leg: No edema.  Lymphadenopathy:     Cervical: No cervical adenopathy.     Upper Body:     Right upper body: No supraclavicular adenopathy.     Left upper body: No supraclavicular adenopathy.  Skin:    General: Skin is warm and dry.  Neurological:     General: No focal deficit present.     Mental Status: She is alert and oriented to person, place, and time. Mental status is at baseline.     Sensory: Sensation is intact.     Motor: Motor function is intact. No weakness.     Deep Tendon Reflexes: Reflexes are normal and symmetric.  Psychiatric:        Attention and Perception: Attention normal.         Mood and Affect: Mood normal.        Speech: Speech normal.        Behavior: Behavior normal.        Thought Content: Thought content normal.        Cognition and Memory: Cognition normal.        Judgment: Judgment normal.   Most Recent Fall Risk Assessment:    06/06/2023   10:31 AM  Fall Risk   Falls in the past year? 0  Number falls in past yr: 0  Injury with Fall? 0  Risk for fall due to : No Fall Risks  Follow up Falls evaluation completed   Most Recent Depression Screenings:    06/06/2023   10:31 AM 02/17/2023    4:03 PM  PHQ 2/9 Scores  PHQ - 2 Score 0 0   Results:  Studies obtained and personally reviewed by me: Labs:     Component Value Date/Time   NA 139 11/27/2023 0953   NA 141 03/27/2021 0000   K 4.6 11/27/2023 0953   CL 103 11/27/2023 0953   CO2 27 11/27/2023 0953   GLUCOSE 98 11/27/2023 0953   BUN 15 11/27/2023 0953   BUN 10 03/27/2021 0000   CREATININE 0.77 11/27/2023 0953   CALCIUM 9.8 11/27/2023 0953   PROT 7.0 11/27/2023 0953   ALBUMIN 4.6 03/27/2021 0000   AST 19 11/27/2023 0953   ALT 22 11/27/2023 0953   ALKPHOS 107 03/27/2021 0000   BILITOT 0.7 11/27/2023 0953    Lab Results  Component Value Date   WBC 5.5 11/27/2023   HGB 15.5 11/27/2023   HCT 46.4 (H) 11/27/2023   MCV 86.4 11/27/2023   PLT 283 11/27/2023   Lab Results  Component Value Date   CHOL 157 11/27/2023   HDL 41 (L) 11/27/2023   LDLCALC 89 11/27/2023   TRIG 176 (H) 11/27/2023   CHOLHDL 3.8 11/27/2023   Lab Results  Component Value Date   HGBA1C 5.5 11/27/2023    Lab Results  Component Value Date   TSH 0.66 11/27/2023  Assessment & Plan:  Other Labs Reviewed today: CMP: WNL HgbA1c: 5.5 TSH: 0.66  Anemia with 12/01/23 CBC, compared to 02/17/23: RBC 5.37, elevated from 5.10; HCT 46.4, elevated from 41.7; otherwise WNL. Has stopped donating blood.    GERD treated with with Omeprazole 20 mg daily.   Hyperlipidemia treated with Rosuvastatin 5mg  daily and  Vascepa 1 gram BID. CT Calcium Total Score of 11 in 2019. Notes that she hasn't been as active due to recent illnesses and inclement weather.    Hypothyroidism treated with Levothyroxine daily.     Anxiety/Insomnia treated with Xanax 0.5 mg.  Obesity w/BMI 34.70 managed with 15 mg Phentermine daily. Today's weight is 215, which is a 4 pound decrease since 06/06/23. Notes that it has been difficult to exercise due to recent illnesses and inclement weather.  Fever Blisters treated with 1000 mg Valtrex as needed  Musculoskeletal Pain treated with 600 mg Ibuprofen when needed, about 2-3/week. She notes that since she takes care of her grandchildren often she is doing a lot of lifting and holding, causing pain in her elbows.   PAP Smear 09/11/2020 was normal with repeat recommended for 2026. Hx of postmenopausal bleeding December 2023 and a subsequent polypectomy and endometrial biopsy which were negative. Repeat biopsy 11/25/22 was also negative with weakly proliferative to atrophic endometrium noted.   Mammogram 12/16/22 with no significant findings, though breast tissue was notably extremely dense with repeat recommendation of 2025  Colonoscopy 02/05/2022 with one flat adenomatous dimunitive polyp removed from ileocecal valve; Diverticulosis in sigmoid colon with repeat recommendation of 2030  Bone Density will not be due until 2026  Vaccine Counseling: Due for Covid-19 and Shingles 2/2; UTD on Flu and Tdap. Discussed receiving 2nd Shingles vaccination - she is agreeable to this but has been hesitant due to possible side effects.    Annual wellness visit done today including the all of the following: Reviewed patient's Family Medical History Reviewed and updated list of patient's medical providers Assessment of cognitive impairment was done Assessed patient's functional ability Established a written schedule for health screening services Health Risk Assessent Completed and  Reviewed  Discussed health benefits of physical activity, and encouraged her to engage in regular exercise appropriate for her age and condition.    I,Emily Lagle,acting as a Neurosurgeon for Margaree Mackintosh, MD.,have documented all relevant documentation on the behalf of Margaree Mackintosh, MD,as directed by  Margaree Mackintosh, MD while in the presence of Margaree Mackintosh, MD.   I, Margaree Mackintosh, MD, have reviewed all documentation for this visit. The documentation on 12/12/23 for the exam, diagnosis, procedures, and orders are all accurate and complete.

## 2023-12-05 NOTE — Patient Instructions (Addendum)
It was a pleasure to see you today. Suggest Shingrix vaccine.  Patient to consider. Labs are stable.  Mammogram is up to date. Medications refilled.

## 2024-02-08 ENCOUNTER — Other Ambulatory Visit: Payer: Self-pay | Admitting: Internal Medicine

## 2024-02-17 DIAGNOSIS — J069 Acute upper respiratory infection, unspecified: Secondary | ICD-10-CM | POA: Diagnosis not present

## 2024-02-17 DIAGNOSIS — R051 Acute cough: Secondary | ICD-10-CM | POA: Diagnosis not present

## 2024-02-17 DIAGNOSIS — J302 Other seasonal allergic rhinitis: Secondary | ICD-10-CM | POA: Diagnosis not present

## 2024-02-29 ENCOUNTER — Other Ambulatory Visit: Payer: Self-pay | Admitting: Internal Medicine

## 2024-03-01 ENCOUNTER — Other Ambulatory Visit: Payer: Self-pay

## 2024-04-16 DIAGNOSIS — Z01419 Encounter for gynecological examination (general) (routine) without abnormal findings: Secondary | ICD-10-CM | POA: Diagnosis not present

## 2024-04-16 DIAGNOSIS — Z1331 Encounter for screening for depression: Secondary | ICD-10-CM | POA: Diagnosis not present

## 2024-04-16 DIAGNOSIS — Z1231 Encounter for screening mammogram for malignant neoplasm of breast: Secondary | ICD-10-CM | POA: Diagnosis not present

## 2024-04-16 LAB — HM MAMMOGRAPHY

## 2024-04-23 ENCOUNTER — Other Ambulatory Visit: Payer: Self-pay | Admitting: Internal Medicine

## 2024-04-23 ENCOUNTER — Other Ambulatory Visit: Payer: Self-pay

## 2024-04-23 DIAGNOSIS — F439 Reaction to severe stress, unspecified: Secondary | ICD-10-CM

## 2024-04-23 MED ORDER — IBUPROFEN 600 MG PO TABS: 600.0000 mg | ORAL_TABLET | Freq: Four times a day (QID) | ORAL | 3 refills | Status: AC | PRN

## 2024-05-18 ENCOUNTER — Other Ambulatory Visit: Payer: Self-pay | Admitting: Internal Medicine

## 2024-05-26 ENCOUNTER — Other Ambulatory Visit: Payer: Self-pay | Admitting: Internal Medicine

## 2024-08-14 ENCOUNTER — Other Ambulatory Visit: Payer: Self-pay | Admitting: Internal Medicine

## 2024-08-22 ENCOUNTER — Other Ambulatory Visit: Payer: Self-pay | Admitting: Internal Medicine

## 2024-10-13 ENCOUNTER — Other Ambulatory Visit (HOSPITAL_BASED_OUTPATIENT_CLINIC_OR_DEPARTMENT_OTHER): Payer: Self-pay

## 2024-10-13 MED ORDER — FLUZONE 0.5 ML IM SUSY
0.5000 mL | PREFILLED_SYRINGE | Freq: Once | INTRAMUSCULAR | 0 refills | Status: AC
  Filled 2024-10-13: qty 0.5, 1d supply, fill #0

## 2024-11-14 ENCOUNTER — Other Ambulatory Visit: Payer: Self-pay | Admitting: Internal Medicine

## 2024-11-25 ENCOUNTER — Other Ambulatory Visit: Payer: Self-pay | Admitting: Internal Medicine

## 2024-11-26 NOTE — Progress Notes (Signed)
 "  Annual Comprehensive Physical Exam.    Patient Care Team: Emmerson Taddei, Ronal PARAS, MD as PCP - General (Internal Medicine) Pietro Redell RAMAN, MD as PCP - Cardiology (Cardiology)  Visit Date: 12/10/24   Chief Complaint  Patient presents with   Annual Exam   Subjective:  Patient: Nancy Lawrence, Female DOB: [birth year], 64 y.o. MRN: [id number] Vitals:   12/10/24 1505  BP: 110/82   Nancy Lawrence is a 64 y.o. Female who presents today for her Annual Comprehensive Physical Exam. Patient has Hyperlipemia, mixed; ALLERGIC RHINITIS, SEASONAL; DYSPEPSIA, CHRONIC; HIATAL HERNIA; SLEEP APNEA; EPIGASTRIC PAIN, CHRONIC; ANEMIA, IRON DEFICIENCY, HX OF; History of IBS; HEADACHE, CHRONIC, HX OF; Sciatica; Fluid retention; Obesity; Anxiety; History of depression; Vitamin D  deficiency; Benign positional vertigo; Neck pain; Hx of adenomatous colonic polyps; FM Hx of CAD; Lung nodule < 6cm on CT; Blood donor - stopped due to iron deficiency; Abscess of groin; Anemia; Cellulitis of axilla; Bilateral impacted cerumen; Contact dermatitis; DDD (degenerative disc disease), cervical; Deviated septum; Family history of diabetes mellitus; Hypothyroidism; Eczema; Muscle pain; Myositis; Nasal turbinate hypertrophy; Polyp of cervix; Psoriasis; Skin lesion; Spasm of back muscles; and Postmenopausal bleeding on their problem list.  History of Anemia with 12/07/2024 CBC MCH 26.7, otherwise WNL.   History of GE Reflux  treated with with Omeprazole  20 mg daily.   History of Hyperlipidemia treated with Rosuvastatin  10 mg daily and vascepa  1 g twice daily. CT Calcium  Total Score of 11 in 2019.     History of Hypothyroidism treated with Levothyroxine  75 mcg daily.     History of Anxiety/Insomnia treated with Xanax  0.5 mg. Situational stress with family discussed.     History of Obesity managed with 15 mg Phentermine  daily. Current weight 171 lb BMI 27.19 she has lost 44 pounds since last visit in 2025. She was previously  on GLP-1 medication but has since discontinued use.    History of Fever Blisters treated with 1000 mg Valtrex  as needed   History of  Musculoskeletal Pain treated with 600 mg Ibuprofen  when needed, about 2-3/week.     Labs 12/07/2024 MCH 26.7, HDL 37, Triglycerides 152, Otherwise WNL.    11/25/22 Pap smear normal    04/16/2024 Mammogram no mammographic evidence of malignancy. Repeat in one year.    02/05/2022 Colonoscopy One diminutive polyp at the ileocecal valve. Resected and retrieved. Pathology found to be diminutive adenoma Diverticulosis in the sigmoid colon. The examination was otherwise normal on direct and retroflexion views. Personal history of colonic polyps. 3 diminutive adenomas 2017. Repeat in 7 years.   Health Maintenance  Topic Date Due   Cervical Cancer Screening (HPV/Pap Cotest)  09/29/2023   COVID-19 Vaccine (4 - 2025-26 season) 12/26/2024 (Originally 07/12/2024)   Zoster Vaccines- Shingrix  (2 of 2) 03/10/2025 (Originally 08/08/2023)   Pneumococcal Vaccine: 50+ Years (1 of 1 - PCV) 12/10/2025 (Originally 08/31/2010)   Mammogram  04/16/2026   Colonoscopy  02/05/2029   DTaP/Tdap/Td (4 - Td or Tdap) 12/30/2032   Influenza Vaccine  Completed   HPV VACCINES (No Doses Required) Completed   Hepatitis C Screening  Completed   Hepatitis B Vaccines 19-59 Average Risk  Aged Out   Meningococcal B Vaccine  Aged Out   HIV Screening  Discontinued    Review of Systems  Constitutional:  Negative for fever and malaise/fatigue.  HENT:  Negative for congestion.   Eyes:  Negative for blurred vision.  Respiratory:  Negative for cough and shortness of breath.   Cardiovascular:  Negative for chest pain, palpitations and leg swelling.  Gastrointestinal:  Negative for vomiting.  Musculoskeletal:  Negative for back pain.  Skin:  Negative for rash.  Neurological:  Negative for loss of consciousness and headaches.   Objective:  Vitals: body mass index is 27.19 kg/m. Today's Vitals    12/10/24 1505  BP: 110/82  Pulse: 69  Temp: 98.2 F (36.8 C)  TempSrc: Oral  SpO2: 98%  Weight: 171 lb (77.6 kg)  Height: 5' 6.5 (1.689 m)   Physical Exam Vitals and nursing note reviewed.  Constitutional:      General: She is not in acute distress.    Appearance: Normal appearance. She is not ill-appearing or toxic-appearing.  HENT:     Head: Normocephalic and atraumatic.     Right Ear: Hearing, tympanic membrane, ear canal and external ear normal.     Left Ear: Hearing, tympanic membrane, ear canal and external ear normal.     Mouth/Throat:     Pharynx: Oropharynx is clear.  Eyes:     Extraocular Movements: Extraocular movements intact.     Pupils: Pupils are equal, round, and reactive to light.  Neck:     Thyroid : No thyroid  mass, thyromegaly or thyroid  tenderness.     Vascular: No carotid bruit.  Cardiovascular:     Rate and Rhythm: Normal rate and regular rhythm. No extrasystoles are present.    Pulses:          Dorsalis pedis pulses are 2+ on the right side and 2+ on the left side.     Heart sounds: Normal heart sounds. No murmur heard.    No friction rub. No gallop.  Pulmonary:     Effort: Pulmonary effort is normal.     Breath sounds: Normal breath sounds. No decreased breath sounds, wheezing, rhonchi or rales.  Chest:     Chest wall: No mass.  Abdominal:     Palpations: Abdomen is soft. There is no hepatomegaly, splenomegaly or mass.     Tenderness: There is no abdominal tenderness.     Hernia: No hernia is present.  Musculoskeletal:     Cervical back: Normal range of motion.     Right lower leg: No edema.     Left lower leg: No edema.  Lymphadenopathy:     Cervical: No cervical adenopathy.     Upper Body:     Right upper body: No supraclavicular adenopathy.     Left upper body: No supraclavicular adenopathy.  Skin:    General: Skin is warm and dry.  Neurological:     General: No focal deficit present.     Mental Status: She is alert and oriented to  person, place, and time. Mental status is at baseline.     Sensory: Sensation is intact.     Motor: Motor function is intact. No weakness.     Deep Tendon Reflexes: Reflexes are normal and symmetric.  Psychiatric:        Attention and Perception: Attention normal.        Mood and Affect: Mood normal.        Speech: Speech normal.        Behavior: Behavior normal.        Thought Content: Thought content normal.        Cognition and Memory: Cognition normal.        Judgment: Judgment normal.     Current Outpatient Medications  Medication Instructions   ALPRAZolam  (XANAX ) 0.5 mg, Oral, Daily at bedtime  cetirizine (ZYRTEC) 10 mg, Daily   clindamycin (CLINDAGEL) 1 % gel Apply topically.   clobetasol ointment (TEMOVATE) 0.05 % 1 application , 2 times daily   [employer] SUNSCREEN SPF 30 EX apply   furosemide  (LASIX ) 40 mg, Oral, Daily PRN   ibuprofen  (ADVIL ) 600 mg, Oral, Every 6 hours PRN   ibuprofen  (ADVIL ) 600 mg, Oral, Every 6 hours PRN   icosapent  Ethyl (VASCEPA ) 2 g, Oral, 2 times daily   Levoxyl  75 mcg, Oral, Daily   NEOMYCIN -POLYMYXIN-HYDROCORTISONE (CORTISPORIN) 1 % SOLN OTIC solution 3 drops, Left EAR, 4 times daily   omeprazole  (PRILOSEC) 20 MG capsule Oral, Daily   phentermine  15 mg, Oral, BH-each morning   rosuvastatin  (CRESTOR ) 10 mg, Oral, Daily   valACYclovir  (VALTREX ) 1000 MG tablet Take 2 tabs by mouth at onset of symptoms and repeat dose once in 12 hours.   Past Medical History:  Diagnosis Date   Allergy    Anemia    Blood donor - stopped due to iron deficiency 02/05/2022   Fluid retention    GERD (gastroesophageal reflux disease)    History of snoring    Hx of adenomatous colonic polyps 03/13/2016   Hyperlipidemia    Hypothyroid    Pneumonia    Pulmonary nodule    Sleep apnea    Vitamin D  deficiency    Medical/Surgical History Narrative:  Allergic/Intolerant to: Allergies[1]  CT Chest was repeated in 2020 and opacity was no longer seen.  She was seen by  Pulmonologist in December 2020. No further imaging was indicated.    Saw cardiologist 2019 because her brother had sudden cardiac death. She has a CT cardiac score but on that study a ground glass attenuating nodule was noted in the right upper lobe measuring 1.6 cm. Score was 11.    She is allergic to Penicillin and has had possible adverse reaction to Crestor  and Celexa. Allergies controlled with Zyrtec 10mg  daily.   Cholecystectomy 2007. Umbilical hernia repair and lipoma removal around 2000. Pilonidal cyst excised 1994. Pneumonia approximately year 2000. Past Surgical History:  Procedure Laterality Date   CHOLECYSTECTOMY     COLONOSCOPY     HERNIA REPAIR     umbilical   Family History  Problem Relation Age of Onset   Ovarian cancer Mother    Heart disease Father    Hyperlipidemia Father    Hypertension Father    Diabetes Father    Heart attack Brother    Diabetes Brother    Hypertension Brother    Hyperlipidemia Brother    Heart disease Brother    Colon polyps Neg Hx    Colon cancer Neg Hx    Esophageal cancer Neg Hx    Stomach cancer Neg Hx    Rectal cancer Neg Hx    Family History Narrative: Father with history of Diabetes, Heart Disease, Hyperlipidemia, MI, Hypertension, and Dementia Mother passed away in 10/23/2023from Ovarian Cancer. 2 brothers with history of Hypertension, Diabetes, Hyperlipidemia; 1 Brother died 03/23/2019aged 18 of an Acute MI.   Paternal Uncle with MI.  Social History   Social History Narrative   Social history: Married.  2 daughters.  Husband is a fish farm manager carrier.  She completed 2 years of college.  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  She does not smoke.  Social alcohol consumption.       Family history: Brother died of an acute MI.  Father with history of diabetes, heart disease, hyperlipidemia, MI, hypertension.  Paternal uncle  with MI.  2 brothers with hypertension, diabetes, hyperlipidemia.  2 sisters.  11/29/21 - Recently retired  from [employer]. Daughter had infant recently needing specialized care at [facility].    12/05/23 - Takes care of her grandchildren whenever possible.   Most Recent Health Risks Assessment:   Most Recent Social Determinants of Health (Including Hx of Tobacco, Alcohol, and Drug Use) SDOH Screenings   Depression (PHQ2-9): Low Risk (06/06/2023)  Tobacco Use: Medium Risk (12/10/2024)   Social History[2]   Most Recent Fall Risk Assessment:    12/10/2024    3:03 PM  Fall Risk   Falls in the past year? 0  Number falls in past yr: 0  Injury with Fall? 0  Risk for fall due to : No Fall Risks  Follow up Falls evaluation completed   Most Recent Anxiety/Depression Screenings:    06/06/2023   10:31 AM 02/17/2023    4:03 PM  PHQ 2/9 Scores  PHQ - 2 Score 0 0    Results:  Studies Obtained And Personally Reviewed By Me:   11/25/22 Pap smear normal    04/16/2024 Mammogram no mammographic evidence of malignancy. Repeat in one year.    02/05/2022 Colonoscopy One diminutive polyp at the ileocecal valve. Resected and retrieved. Pathology found to be diminutive adenoma Diverticulosis in the sigmoid colon. The examination was otherwise normal on direct and retroflexion views. Personal history of colonic polyps. 3 diminutive adenomas 2017. Repeat in 7 years.   Labs:  CBC w/ Differential Lab Results  Component Value Date   WBC 4.2 12/07/2024   RBC 5.05 12/07/2024   HGB 13.5 12/07/2024   HCT 41.8 12/07/2024   PLT 270 12/07/2024   MCV 82.8 12/07/2024   MCH 26.7 (L) 12/07/2024   MCHC 32.3 12/07/2024   RDW 13.0 12/07/2024   MPV 10.0 12/07/2024   LYMPHSABS 1,574 02/17/2023   MONOABS 0.4 04/20/2007   BASOSABS 29 12/07/2024    Comprehensive Metabolic Panel Lab Results  Component Value Date   NA 141 12/07/2024   K 4.7 12/07/2024   CL 105 12/07/2024   CO2 29 12/07/2024   GLUCOSE 89 12/07/2024   BUN 12 12/07/2024   CREATININE 0.72 12/07/2024   CALCIUM  10.0  12/07/2024   PROT 6.9 12/07/2024   ALBUMIN 4.6 03/27/2021   AST 24 12/07/2024   ALT 22 12/07/2024   ALKPHOS 107 03/27/2021   BILITOT 0.6 12/07/2024   EGFR 93 12/07/2024   Lipid Panel  Lab Results  Component Value Date   CHOL 134 12/07/2024   HDL 37 (L) 12/07/2024   LDLCALC 74 12/07/2024   TRIG 152 (H) 12/07/2024   A1c Lab Results  Component Value Date   HGBA1C 5.1 12/07/2024    TSH Lab Results  Component Value Date   TSH 0.52 12/07/2024    Assessment & Plan:   Anemia: with 12/07/2024 CBC MCH 26.7, otherwise WNL.   GE Reflux:  treated with with Omeprazole  20 mg daily.   Hyperlipidemia: treated with Rosuvastatin  10 mg daily and vascepa  1 g twice daily. CT Calcium  Total Score of 11 in 2019.     Hypothyroidism: treated with Levothyroxine  75 mcg daily.     Anxiety/Insomnia: treated with Xanax  0.5 mg. Situational stress with family discussed.     Obesity: managed with 15 mg Phentermine  daily. Current weight 171 lb BMI 27.19 she has lost 44 pounds since last visit in 2025. She was previously on GLP-1 medication but has since discontinued use.  Fever Blisters: treated with 1000 mg Valtrex  as needed   Musculoskeletal Pain: treated with 600 mg Ibuprofen  when needed, about 2-3/week.      11/25/22 Pap smear normal    04/16/2024 Mammogram no mammographic evidence of malignancy. Repeat in one year.    02/05/2022 Colonoscopy One diminutive polyp at the ileocecal valve. Resected and retrieved. Pathology found to be diminutive adenoma Diverticulosis in the sigmoid colon. The examination was otherwise normal on direct and retroflexion views. Personal history of colonic polyps. 3 diminutive adenomas 2017. Repeat in 7 years.     Annual Comprehensive Physical Exam done today including the all of the following: Reviewed patient's Family Medical History Reviewed patient's SDOH and reviewed tobacco, alcohol, and drug use.  Reviewed and updated list of patient's medical  providers Assessment of cognitive impairment was done Assessed patient's functional ability Established a written schedule for health screening services Health Risk Assessent Completed and Reviewed  Discussed health benefits of physical activity, and encouraged her to engage in regular exercise appropriate for her age and condition.   I,Makayla C Reid,acting as a scribe for Ronal JINNY Hailstone, MD.,have documented all relevant documentation on the behalf of Ronal JINNY Hailstone, MD,as directed by  Ronal JINNY Hailstone, MD while in the presence of Ronal JINNY Hailstone, MD.  I, Ronal JINNY Hailstone, MD, have reviewed all documentation for and agree with the above Annual Wellness Visit documentation.  Ronal JINNY Hailstone, MD Internal Medicine 12/10/2024     [1]  Allergies Allergen Reactions   Penicillins Rash    Per pt,can take certain penicillins!   Statins     Cause muscle pain, edema,changes in blood work!   Citalopram Hydrobromide   [2]  Social History Tobacco Use   Smoking status: Former    Current packs/day: 0.00    Average packs/day: 0.5 packs/day for 12.0 years (6.0 ttl pk-yrs)    Types: Cigarettes    Start date: 03/05/1982    Quit date: 03/05/1994    Years since quitting: 30.7   Smokeless tobacco: Never  Vaping Use   Vaping status: Never Used  Substance Use Topics   Alcohol use: Yes    Alcohol/week: 1.0 standard drink of alcohol    Types: 1 Cans of beer per week    Comment: socially   Drug use: No   "

## 2024-12-07 ENCOUNTER — Other Ambulatory Visit: Payer: Federal, State, Local not specified - PPO

## 2024-12-07 ENCOUNTER — Other Ambulatory Visit

## 2024-12-07 DIAGNOSIS — K219 Gastro-esophageal reflux disease without esophagitis: Secondary | ICD-10-CM

## 2024-12-07 DIAGNOSIS — E782 Mixed hyperlipidemia: Secondary | ICD-10-CM

## 2024-12-07 DIAGNOSIS — G4733 Obstructive sleep apnea (adult) (pediatric): Secondary | ICD-10-CM

## 2024-12-07 DIAGNOSIS — E039 Hypothyroidism, unspecified: Secondary | ICD-10-CM

## 2024-12-07 DIAGNOSIS — Z0000 Encounter for general adult medical examination without abnormal findings: Secondary | ICD-10-CM

## 2024-12-07 DIAGNOSIS — R7302 Impaired glucose tolerance (oral): Secondary | ICD-10-CM

## 2024-12-08 LAB — CBC WITH DIFFERENTIAL/PLATELET
Absolute Lymphocytes: 1243 {cells}/uL (ref 850–3900)
Absolute Monocytes: 357 {cells}/uL (ref 200–950)
Basophils Absolute: 29 {cells}/uL (ref 0–200)
Basophils Relative: 0.7 %
Eosinophils Absolute: 130 {cells}/uL (ref 15–500)
Eosinophils Relative: 3.1 %
HCT: 41.8 % (ref 35.9–46.0)
Hemoglobin: 13.5 g/dL (ref 11.7–15.5)
MCH: 26.7 pg — ABNORMAL LOW (ref 27.0–33.0)
MCHC: 32.3 g/dL (ref 31.6–35.4)
MCV: 82.8 fL (ref 81.4–101.7)
MPV: 10 fL (ref 7.5–12.5)
Monocytes Relative: 8.5 %
Neutro Abs: 2440 {cells}/uL (ref 1500–7800)
Neutrophils Relative %: 58.1 %
Platelets: 270 10*3/uL (ref 140–400)
RBC: 5.05 Million/uL (ref 3.80–5.10)
RDW: 13 % (ref 11.0–15.0)
Total Lymphocyte: 29.6 %
WBC: 4.2 10*3/uL (ref 3.8–10.8)

## 2024-12-08 LAB — COMPREHENSIVE METABOLIC PANEL WITH GFR
AG Ratio: 1.9 (calc) (ref 1.0–2.5)
ALT: 22 U/L (ref 6–29)
AST: 24 U/L (ref 10–35)
Albumin: 4.5 g/dL (ref 3.6–5.1)
Alkaline phosphatase (APISO): 76 U/L (ref 37–153)
BUN: 12 mg/dL (ref 7–25)
CO2: 29 mmol/L (ref 20–32)
Calcium: 10 mg/dL (ref 8.6–10.4)
Chloride: 105 mmol/L (ref 98–110)
Creat: 0.72 mg/dL (ref 0.50–1.05)
Globulin: 2.4 g/dL (ref 1.9–3.7)
Glucose, Bld: 89 mg/dL (ref 65–99)
Potassium: 4.7 mmol/L (ref 3.5–5.3)
Sodium: 141 mmol/L (ref 135–146)
Total Bilirubin: 0.6 mg/dL (ref 0.2–1.2)
Total Protein: 6.9 g/dL (ref 6.1–8.1)
eGFR: 93 mL/min/{1.73_m2}

## 2024-12-08 LAB — LIPID PANEL
Cholesterol: 134 mg/dL
HDL: 37 mg/dL — ABNORMAL LOW
LDL Cholesterol (Calc): 74 mg/dL
Non-HDL Cholesterol (Calc): 97 mg/dL
Total CHOL/HDL Ratio: 3.6 (calc)
Triglycerides: 152 mg/dL — ABNORMAL HIGH

## 2024-12-08 LAB — HEMOGLOBIN A1C
Hgb A1c MFr Bld: 5.1 %
Mean Plasma Glucose: 100 mg/dL
eAG (mmol/L): 5.5 mmol/L

## 2024-12-08 LAB — TSH: TSH: 0.52 m[IU]/L (ref 0.40–4.50)

## 2024-12-10 ENCOUNTER — Encounter: Payer: Self-pay | Admitting: Internal Medicine

## 2024-12-10 ENCOUNTER — Ambulatory Visit: Payer: Federal, State, Local not specified - PPO | Admitting: Internal Medicine

## 2024-12-10 VITALS — BP 110/82 | HR 69 | Temp 98.2°F | Ht 66.5 in | Wt 171.0 lb

## 2024-12-10 DIAGNOSIS — Z6827 Body mass index (BMI) 27.0-27.9, adult: Secondary | ICD-10-CM | POA: Diagnosis not present

## 2024-12-10 DIAGNOSIS — Z0001 Encounter for general adult medical examination with abnormal findings: Secondary | ICD-10-CM | POA: Diagnosis not present

## 2024-12-10 DIAGNOSIS — F5102 Adjustment insomnia: Secondary | ICD-10-CM

## 2024-12-10 DIAGNOSIS — E785 Hyperlipidemia, unspecified: Secondary | ICD-10-CM | POA: Diagnosis not present

## 2024-12-10 DIAGNOSIS — G4700 Insomnia, unspecified: Secondary | ICD-10-CM

## 2024-12-10 DIAGNOSIS — E782 Mixed hyperlipidemia: Secondary | ICD-10-CM

## 2024-12-10 DIAGNOSIS — F439 Reaction to severe stress, unspecified: Secondary | ICD-10-CM

## 2024-12-10 DIAGNOSIS — E039 Hypothyroidism, unspecified: Secondary | ICD-10-CM | POA: Diagnosis not present

## 2024-12-10 DIAGNOSIS — M791 Myalgia, unspecified site: Secondary | ICD-10-CM

## 2024-12-10 DIAGNOSIS — E669 Obesity, unspecified: Secondary | ICD-10-CM

## 2024-12-10 DIAGNOSIS — K219 Gastro-esophageal reflux disease without esophagitis: Secondary | ICD-10-CM

## 2024-12-10 DIAGNOSIS — F419 Anxiety disorder, unspecified: Secondary | ICD-10-CM

## 2024-12-10 DIAGNOSIS — Z0000 Encounter for general adult medical examination without abnormal findings: Secondary | ICD-10-CM

## 2024-12-10 LAB — POCT URINALYSIS DIP (CLINITEK)
Bilirubin, UA: NEGATIVE
Blood, UA: NEGATIVE
Glucose, UA: NEGATIVE mg/dL
Ketones, POC UA: NEGATIVE mg/dL
Leukocytes, UA: NEGATIVE
Nitrite, UA: NEGATIVE
Spec Grav, UA: 1.025
Urobilinogen, UA: 0.2 U/dL
pH, UA: 6

## 2024-12-11 DIAGNOSIS — G4700 Insomnia, unspecified: Secondary | ICD-10-CM | POA: Insufficient documentation

## 2024-12-11 NOTE — Patient Instructions (Addendum)
 It was a pleasure to see you today. Continue current medications. Situational stress discussed.Continue lipid lowering medication and thyroid  replacement medication. Continue omeprazole  for reflux. Continue Xanax  for anxiety.Return in one year or as needed. GYN care done at [facility].

## 2025-12-12 ENCOUNTER — Other Ambulatory Visit

## 2025-12-23 ENCOUNTER — Encounter: Admitting: Internal Medicine
